# Patient Record
Sex: Male | Born: 1960 | Race: White | Hispanic: No | Marital: Married | Smoking: Never smoker
Health system: Southern US, Community
[De-identification: ages and names within clinical notes are randomized; demographics above are authoritative.]

## PROBLEM LIST (undated history)

## (undated) DIAGNOSIS — N200 Calculus of kidney: Secondary | ICD-10-CM

## (undated) DIAGNOSIS — I493 Ventricular premature depolarization: Secondary | ICD-10-CM

## (undated) DIAGNOSIS — J302 Other seasonal allergic rhinitis: Secondary | ICD-10-CM

## (undated) HISTORY — DX: Ventricular premature depolarization: I49.3

## (undated) HISTORY — DX: Other seasonal allergic rhinitis: J30.2

## (undated) HISTORY — DX: Calculus of kidney: N20.0

## (undated) HISTORY — PX: WISDOM TOOTH EXTRACTION: SHX21

---

## 1993-01-14 HISTORY — PX: WRIST FRACTURE SURGERY: SHX121

## 1997-01-14 DIAGNOSIS — N200 Calculus of kidney: Secondary | ICD-10-CM

## 1997-01-14 HISTORY — PX: OTHER SURGICAL HISTORY: SHX169

## 1997-01-14 HISTORY — DX: Calculus of kidney: N20.0

## 2004-10-11 ENCOUNTER — Ambulatory Visit: Payer: Self-pay | Admitting: Internal Medicine

## 2004-10-11 LAB — FECAL OCCULT BLOOD, GUAIAC: Fecal Occult Blood: NEGATIVE

## 2007-03-09 DIAGNOSIS — R748 Abnormal levels of other serum enzymes: Secondary | ICD-10-CM | POA: Insufficient documentation

## 2007-03-09 DIAGNOSIS — J309 Allergic rhinitis, unspecified: Secondary | ICD-10-CM

## 2007-03-13 ENCOUNTER — Ambulatory Visit: Payer: Self-pay | Admitting: Internal Medicine

## 2007-03-17 ENCOUNTER — Encounter (INDEPENDENT_AMBULATORY_CARE_PROVIDER_SITE_OTHER): Payer: Self-pay | Admitting: *Deleted

## 2007-03-18 ENCOUNTER — Encounter: Payer: Self-pay | Admitting: Internal Medicine

## 2007-03-23 ENCOUNTER — Encounter (INDEPENDENT_AMBULATORY_CARE_PROVIDER_SITE_OTHER): Payer: Self-pay | Admitting: *Deleted

## 2009-04-26 ENCOUNTER — Ambulatory Visit: Payer: Self-pay | Admitting: Internal Medicine

## 2009-04-26 DIAGNOSIS — Z87442 Personal history of urinary calculi: Secondary | ICD-10-CM

## 2009-05-01 LAB — CONVERTED CEMR LAB
BUN: 11 mg/dL (ref 6–23)
Basophils Absolute: 0 10*3/uL (ref 0.0–0.1)
Bilirubin, Direct: 0 mg/dL (ref 0.0–0.3)
CO2: 32 meq/L (ref 19–32)
Chloride: 103 meq/L (ref 96–112)
Cholesterol: 194 mg/dL (ref 0–200)
Creatinine, Ser: 1 mg/dL (ref 0.4–1.5)
Eosinophils Absolute: 0.3 10*3/uL (ref 0.0–0.7)
Glucose, Bld: 79 mg/dL (ref 70–99)
HCT: 42 % (ref 39.0–52.0)
LDL Cholesterol: 129 mg/dL — ABNORMAL HIGH (ref 0–99)
Lymphs Abs: 1.3 10*3/uL (ref 0.7–4.0)
MCHC: 34.1 g/dL (ref 30.0–36.0)
MCV: 86.3 fL (ref 78.0–100.0)
Monocytes Absolute: 0.3 10*3/uL (ref 0.1–1.0)
Neutrophils Relative %: 58.2 % (ref 43.0–77.0)
Platelets: 270 10*3/uL (ref 150.0–400.0)
RDW: 13.3 % (ref 11.5–14.6)
TSH: 0.8 microintl units/mL (ref 0.35–5.50)
Total Bilirubin: 0.4 mg/dL (ref 0.3–1.2)
Triglycerides: 84 mg/dL (ref 0.0–149.0)
WBC: 4.6 10*3/uL (ref 4.5–10.5)

## 2009-11-22 ENCOUNTER — Ambulatory Visit: Payer: Self-pay | Admitting: Internal Medicine

## 2009-11-22 DIAGNOSIS — R079 Chest pain, unspecified: Secondary | ICD-10-CM

## 2010-02-11 LAB — CONVERTED CEMR LAB
ALT: 18 units/L (ref 0–53)
BUN: 15 mg/dL (ref 6–23)
Bilirubin, Direct: 0.2 mg/dL (ref 0.0–0.3)
Calcium: 9.4 mg/dL (ref 8.4–10.5)
Eosinophils Absolute: 0.1 10*3/uL (ref 0.0–0.6)
GFR calc Af Amer: 93 mL/min
GFR calc non Af Amer: 77 mL/min
Glucose, Bld: 84 mg/dL (ref 70–99)
Lymphocytes Relative: 29.7 % (ref 12.0–46.0)
MCV: 86.9 fL (ref 78.0–100.0)
Monocytes Absolute: 0.4 10*3/uL (ref 0.2–0.7)
Monocytes Relative: 8.8 % (ref 3.0–11.0)
Neutro Abs: 2.5 10*3/uL (ref 1.4–7.7)
Platelets: 236 10*3/uL (ref 150–400)
Potassium: 4.1 meq/L (ref 3.5–5.1)
TSH: 0.68 microintl units/mL (ref 0.35–5.50)

## 2010-02-13 NOTE — Assessment & Plan Note (Signed)
Summary: pain rt side under arm/cbs   Vital Signs:  Patient profile:   50 year old male Weight:      167.2 pounds BMI:     22.92 Temp:     98.4 degrees F oral Pulse rate:   72 / minute Resp:     14 per minute BP sitting:   114 / 68  (left arm) Cuff size:   large  Vitals Entered By: Shonna Chock CMA (November 22, 2009 9:15 AM) CC: Pain under arm/rib cage area on right side x 4 weeks or longer    CC:  Pain under arm/rib cage area on right side x 4 weeks or longer .  History of Present Illness:      This is a 50 year old male who presents with chest pain X 4-5 weeks.  The patient denies resting chest pain, exertional chest pain, nausea, vomiting, diaphoresis, shortness of breath, palpitations, dizziness, light headedness, syncope, abdominal pain or indigestion.  The pain is described as intermittent,  dull for 15-60 minutees & sharp for seconds occasionally.  The pain is located in the right  lateral  chest  @ AAL and the pain does not radiate.  The pain is brought on or made worse by any activity after he gets up in am. It is not made worse by  coughing, deep breathing, exercising ( Elliptical & weight machine for 60 minute #X/ week) or  upper body movement.  The pain is relieved or improved with rest overnight.PMH of R posterior thorax shingles 2005.  Rx: none  Current Medications (verified): 1)  Loratadine 10 Mg .... As Needed  Allergies (verified): No Known Drug Allergies  Past History:  Past Medical History: Allergic rhinitis elevated alkaline phosphatase Premature ventricular contractions, PMH of  Nephrolithiasis, PMH  of X 1  Blood Donor ; No Lipid risk if LDL < 130 based on NMR Lipoprofile  Review of Systems General:  Denies chills, fever, and weight loss. Resp:  Denies coughing up blood, pleuritic, shortness of breath, sputum productive, and wheezing. Derm:  Denies lesion(s) and rash. Neuro:  Denies numbness, tingling, and weakness.  Physical Exam  General:   well-nourished,in no acute distress; alert,appropriate and cooperative throughout examination Chest Wall:  No  tenderness  to  palpation , compression . Lungs:  Normal respiratory effort, chest expands symmetrically. Lungs are clear to auscultation, no crackles or wheezes. Heart:  regular rhythm, no murmur, no gallop, no rub, no JVD, and bradycardia.   Abdomen:  Bowel sounds positive,abdomen soft and non-tender without masses, organomegaly or hernias noted. Msk:  R posterior thoracic muscles asymmetric R > L  suggesting scoliosis Pulses:  R and L carotid,radial pulses are full and equal bilaterally Extremities:  No clubbing, cyanosis, edema, or deformity noted with normal full range of motion of all joints.   Homan's neg Neurologic:  alert & oriented X3.   Skin:  minor vitiliginous scars R posterior thorax Cervical Nodes:  No lymphadenopathy noted Axillary Nodes:  No palpable lymphadenopathy Psych:  memory intact for recent and remote, normally interactive, and good eye contact.     Impression & Recommendations:  Problem # 1:  CHEST PAIN (ICD-786.50)  R/O T- 5 radicular pain; PMH of shingles in same area. Possible scoliosis  Orders: T-2 View CXR (71020TC) T-Thoracic Spine 2 Views (24401UU)  Complete Medication List: 1)  Loratadine 10 Mg  .... As needed 2)  Tramadol Hcl 50 Mg Tabs (Tramadol hcl) .... 1/2 -1 every 6 hrs as  needed  Patient Instructions: 1)  Films @ [address] Prescriptions: TRAMADOL HCL 50 MG TABS (TRAMADOL HCL) 1/2 -1 every 6 hrs as needed  #30 x 0   Entered and Authorized by:   Marga Melnick MD   Signed by:   Marga Melnick MD on 11/22/2009   Method used:   Print then Give to Patient   RxID:   [id number]    Orders Added: 1)  Est. Patient Level IV [62376] 2)  T-2 View CXR [71020TC] 3)  T-Thoracic Spine 2 Views [72070TC]

## 2010-02-13 NOTE — Assessment & Plan Note (Signed)
Summary: cpx/bringing in form/kdc   Vital Signs:  Patient profile:   50 year old male Height:      71.75 inches Weight:      165 pounds BMI:     22.62 Temp:     98.6 degrees F oral Pulse rate:   64 / minute Resp:     14 per minute BP sitting:   120 / 66  (left arm) Cuff size:   large  Vitals Entered By: Shonna Chock (April 26, 2009 11:02 AM)  Comments REVIEWED MED LIST, PATIENT AGREED DOSE AND INSTRUCTION CORRECT  **PATIENT AWARE TO RETURN ON FRIDAY FOR PPD(SKIN TEST READING)**   History of Present Illness: Timothy Miles is here for a physical; he is asymptomatic.  Preventive Screening-Counseling & Management  Caffeine-Diet-Exercise     Does Patient Exercise: yes  Allergies (verified): No Known Drug Allergies  Past History:  Past Medical History: Allergic rhinitis elevated alkaline phosphatase premature ventricular contractions, PMH of  Nephrolithiasis, hx of X 1  Blood Donor ; No Lipid risk if LDL < 130 based on NMR Lipoprofile  Past Surgical History: OP fracture L wrist Renal calculus OP (2000) Wisdom Teeth Extraction  Family History: Father: Melanoma Mother: basal cell skin cancer Siblings: brother: +/- DM; MGF lung cancer Daughter:  Angelman's Syndrome (3yo mental status, 15th chromosome partial deletion)  Social History: Never Smoked Alcohol use-yes: socially No diet; no caffeine Occupation:Maintenance Advice worker Married Regular exercise-yes: 3 hrs of CVE & weights  Review of Systems  The patient denies anorexia, fever, weight loss, weight gain, vision loss, decreased hearing, hoarseness, chest pain, syncope, dyspnea on exertion, peripheral edema, prolonged cough, headaches, hemoptysis, abdominal pain, melena, hematochezia, severe indigestion/heartburn, hematuria, suspicious skin lesions, depression, unusual weight change, abnormal bleeding, enlarged lymph nodes, and angioedema.    Physical Exam  General:  Well-developed,well-nourished;  alert,appropriate and cooperative throughout examination Head:  Normocephalic and atraumatic without obvious abnormalities. No apparent alopecia  Eyes:  No corneal or conjunctival inflammation noted.Perrla. Funduscopic exam benign, without hemorrhages, exudates or papilledema.  Ears:  External ear exam shows no significant lesions or deformities.  Otoscopic examination reveals clear canals, tympanic membranes are intact bilaterally without bulging, retraction, inflammation or discharge. Hearing is grossly normal bilaterally. Nose:  External nasal examination shows no deformity or inflammation. Nasal mucosa are pink and moist without lesions or exudates. Mouth:  Oral mucosa and oropharynx without lesions or exudates.  Teeth in good repair. Neck:  No deformities, masses, or tenderness noted. Minimal asymmetry of thyroid  Lungs:  Normal respiratory effort, chest expands symmetrically. Lungs are clear to auscultation, no crackles or wheezes. Heart:  Normal rate and regular rhythm. S1 and S2 normal without gallop, murmur, click, rub . S4 with slurring @ apex Abdomen:  Bowel sounds positive,abdomen  well muscled  and non-tender without masses, organomegaly or hernias noted. Rectal:  No external abnormalities noted. Normal sphincter tone. No rectal masses or tenderness. Genitalia:  Testes bilaterally descended without nodularity, tenderness or masses. No scrotal masses or lesions. No penis lesions or urethral discharge. Prostate:  no nodules, no asymmetry, no induration, and R asymmetrical  upper limits enlargement.   Msk:  No deformity or scoliosis noted of thoracic or lumbar spine.   Pulses:  R and L carotid,radial,dorsalis pedis and posterior tibial pulses are full and equal bilaterally Extremities:  No clubbing, cyanosis, edema, or deformity noted with normal full range of motion of all joints.   Neurologic:  alert & oriented X3 and DTRs symmetrical and 1/2+  Skin:  Intact without suspicious lesions or  rashes Cervical Nodes:  No lymphadenopathy noted Axillary Nodes:  No palpable lymphadenopathy Inguinal Nodes:  No significant adenopathy Psych:  memory intact for recent and remote, normally interactive, and good eye contact.     Impression & Recommendations:  Problem # 1:  ROUTINE GENERAL MEDICAL EXAM@HEALTH  CARE FACL (ICD-V70.0)  Orders: EKG w/ Interpretation (93000) Venipuncture (14782) TLB-Lipid Panel (80061-LIPID) TLB-BMP (Basic Metabolic Panel-BMET) (80048-METABOL) TLB-CBC Platelet - w/Differential (85025-CBCD) TLB-Hepatic/Liver Function Pnl (80076-HEPATIC) TLB-TSH (Thyroid Stimulating Hormone) (84443-TSH)  Problem # 2:  ALLERGIC RHINITIS (ICD-477.9)  Orders: Venipuncture (95621)  Problem # 3:  NEPHROLITHIASIS, HX OF (ICD-V13.01)  Orders: Venipuncture (30865)  Complete Medication List: 1)  Loratadine 10 Mg  .... As needed  Other Orders: Tdap => 50yrs IM (78469) TB Skin Test [phone]) Admin 1st Vaccine (84132) Admin of Any Addtl Vaccine (44010)  Patient Instructions: 1)  Carry corrected records when traveling   Immunizations Administered:  Tetanus Vaccine:    Vaccine Type: Tdap    Site: right deltoid    Mfr: GlaxoSmithKline    Dose: 0.5 ml    Route: IM    Given by: Chrae Malloy    Exp. Date: 04/08/2011    Lot #: [id number]    VIS given: 12/02/06 version given April 26, 2009.  PPD Skin Test:    Vaccine Type: PPD    Site: left forearm    Mfr: Sanofi Pasteur    Dose: 0.1 ml    Route: ID    Given by: Shonna Chock    Exp. Date: 06/11/2011    Lot #: [id number]     Appended Document: cpx/bringing in form/kdc  Laboratory Results   Urine Tests   Date/Time Reported: April 26, 2009 1:10 PM   Routine Urinalysis   Color: yellow Appearance: Clear Glucose: negative   (Normal Range: Negative) Bilirubin: negative   (Normal Range: Negative) Ketone: negative   (Normal Range: Negative) Spec. Gravity: 1.010   (Normal Range: 1.003-1.035) Blood: negative    (Normal Range: Negative) pH: 6.0   (Normal Range: 5.0-8.0) Protein: negative   (Normal Range: Negative) Urobilinogen: negative   (Normal Range: 0-1) Nitrite: negative   (Normal Range: Negative) Leukocyte Esterace: negative   (Normal Range: Negative)    Comments: Floydene Flock  April 26, 2009 1:10 PM      Appended Document: cpx/bringing in form/kdc  Laboratory Results       PPD Results    Date of reading: 04/28/2009    Results: < 5mm    Interpretation: negative

## 2010-03-14 ENCOUNTER — Encounter: Payer: Self-pay | Admitting: Internal Medicine

## 2010-04-30 ENCOUNTER — Encounter: Payer: Self-pay | Admitting: Internal Medicine

## 2010-04-30 ENCOUNTER — Ambulatory Visit (INDEPENDENT_AMBULATORY_CARE_PROVIDER_SITE_OTHER): Payer: BC Managed Care – PPO | Admitting: Internal Medicine

## 2010-04-30 VITALS — BP 112/68 | HR 72 | Temp 97.9°F | Resp 14 | Ht 72.0 in | Wt 164.8 lb

## 2010-04-30 DIAGNOSIS — Z0000 Encounter for general adult medical examination without abnormal findings: Secondary | ICD-10-CM

## 2010-04-30 DIAGNOSIS — J302 Other seasonal allergic rhinitis: Secondary | ICD-10-CM

## 2010-04-30 DIAGNOSIS — N429 Disorder of prostate, unspecified: Secondary | ICD-10-CM

## 2010-04-30 DIAGNOSIS — Z136 Encounter for screening for cardiovascular disorders: Secondary | ICD-10-CM

## 2010-04-30 DIAGNOSIS — E785 Hyperlipidemia, unspecified: Secondary | ICD-10-CM

## 2010-04-30 DIAGNOSIS — J309 Allergic rhinitis, unspecified: Secondary | ICD-10-CM

## 2010-04-30 LAB — CBC WITH DIFFERENTIAL/PLATELET
Basophils Relative: 0.8 % (ref 0.0–3.0)
Eosinophils Relative: 2 % (ref 0.0–5.0)
Hemoglobin: 14.4 g/dL (ref 13.0–17.0)
Lymphocytes Relative: 27.8 % (ref 12.0–46.0)
MCHC: 34 g/dL (ref 30.0–36.0)
Monocytes Relative: 9.8 % (ref 3.0–12.0)
Neutro Abs: 2.2 10*3/uL (ref 1.4–7.7)
RBC: 4.87 Mil/uL (ref 4.22–5.81)
WBC: 3.7 10*3/uL — ABNORMAL LOW (ref 4.5–10.5)

## 2010-04-30 LAB — HEPATIC FUNCTION PANEL: Total Bilirubin: 1.2 mg/dL (ref 0.3–1.2)

## 2010-04-30 LAB — BASIC METABOLIC PANEL
CO2: 30 mEq/L (ref 19–32)
Chloride: 104 mEq/L (ref 96–112)
Creatinine, Ser: 1 mg/dL (ref 0.4–1.5)
Sodium: 139 mEq/L (ref 135–145)

## 2010-04-30 LAB — LIPID PANEL
HDL: 47.1 mg/dL (ref >39.00)
Total CHOL/HDL Ratio: 4
VLDL: 11 mg/dL (ref 0.0–40.0)

## 2010-04-30 LAB — LDL CHOLESTEROL, DIRECT: Direct LDL: 143 mg/dL

## 2010-04-30 LAB — PSA: PSA: 0.53 ng/mL (ref 0.10–4.00)

## 2010-04-30 LAB — TSH: TSH: 0.49 u[IU]/mL (ref 0.35–5.50)

## 2010-04-30 NOTE — Patient Instructions (Signed)
Preventive Health Care: Exercise at least 30-45 minutes a day,  3-4 days a week.  Eat a low-fat diet with lots of fruits and vegetables, up to 7-9 servings per day. Avoid obesity; your goal is waist measurement < 40 inches.Consume less than 40 grams of sugar per day from foods & drinks with High Fructose Corn Sugar as #2,3 or # 4 on label. Eye Doctor - have an eye exam @ least annually.

## 2010-04-30 NOTE — Progress Notes (Signed)
Subjective:    Patient ID: Timothy Miles, male    DOB: 05-Nov-1960, 50 y.o.   MRN: [id number]  HPI He is here for a physical;his only active issue is seasonal rhinoconjunctivitis which is well controlled by Loratidine as needed. CVE 3 X/ week for 60 minutes & gardening w/o symptoms.    Review of Systems  Patient reports no  vision/ hearing changes,anorexia, weight change, fever ,adenopathy, persistant / recurrent hoarseness, swallowing issues, chest pain,palpitations, edema,persistant / recurrent cough, hemoptysis, dyspnea(rest, exertional, paroxysmal nocturnal), gastrointestinal  bleeding (melena, rectal bleeding), abdominal pain, excessive heart burn, GU symptoms( dysuria, hematuria, pyuria, voiding/incontinence  issues) syncope, focal weakness, memory loss,numbness & tingling, skin/hair/nail changes,depression, anxiety, abnormal bruising/bleeding, musculoskeletal symptoms/signs.      Objective:   Physical Exam Gen.: Thin, healthy and well-nourished in appearance. Alert, appropriate and cooperative throughout exam. Head: Normocephalic without obvious abnormalities;  no alopecia  Eyes: No corneal or conjunctival inflammation noted. Pupils equal round reactive to light and accommodation. Fundal exam is benign without hemorrhages, exudate, papilledema. Extraocular motion intact. Vision grossly normal. Ears: External  ear exam reveals no significant lesions or deformities. Canals clear .TMs normal. Hearing is grossly normal bilaterally. Nose: External nasal exam reveals no deformity or inflammation. Nasal mucosa are pink and moist. No lesions or exudates noted. Septum  w/o deviation / dislocation.  Mouth: Oral mucosa and oropharynx reveal no lesions or exudates. Teeth in good repair. Neck: No deformities, masses, or tenderness noted. Range of motion  Normal . Thyroid  Physiologic asymmetry , R > L w/o nodules. Lungs: Normal respiratory effort; chest expands symmetrically. Lungs are clear to  auscultation without rales, wheezes, or increased work of breathing. Heart: Normal rate and rhythm. Normal S1 and S2. No gallop, click, or rub. S4 ;  No  murmur. Abdomen: Bowel sounds normal; abdomen soft and nontender. No masses, organomegaly or hernias noted. Genitalia: small L varicocoele & tiny granuloma. Asymmetry of prostate ; L 1.25 + ; R 1.0 +  w/o noodules.                                                                                     Musculoskeletal/extremities: No deformity or scoliosis noted of  the thoracic or lumbar spine. No clubbing, cyanosis, edema, or deformity noted. Range of motion  normal .Tone & strength  normal.Joints normal. Nail health  good. Vascular: Carotid, radial artery, dorsalis pedis and dorsalis posterior tibial pulses are full and equal. No bruits present. Neurologic: Alert and oriented x3. Deep tendon reflexes symmetrical and normal.         Skin: Intact without suspicious lesions or rashes. Lymph: No cervical, axillary, or inguinal lymphadenopathy present. Psych: Mood and affect are normal. Normally interactive  Assessment & Plan:  #1 wellness exam; no significant findings  #2 seasonal rhinoconjunctivitis; good control with Loratadine  #3 mild asymmetry of the prostate; negative family history for prostate disease.  Plan: #1 in addition to the routine fasting labs; PSA will be added to assess the minimal asymmetry of the prostate.

## 2010-11-05 ENCOUNTER — Ambulatory Visit (INDEPENDENT_AMBULATORY_CARE_PROVIDER_SITE_OTHER): Payer: BC Managed Care – PPO | Admitting: Internal Medicine

## 2010-11-05 ENCOUNTER — Encounter: Payer: Self-pay | Admitting: Internal Medicine

## 2010-11-05 DIAGNOSIS — M25462 Effusion, left knee: Secondary | ICD-10-CM

## 2010-11-05 DIAGNOSIS — M25569 Pain in unspecified knee: Secondary | ICD-10-CM

## 2010-11-05 DIAGNOSIS — M25562 Pain in left knee: Secondary | ICD-10-CM

## 2010-11-05 DIAGNOSIS — M25469 Effusion, unspecified knee: Secondary | ICD-10-CM

## 2010-11-05 NOTE — Progress Notes (Signed)
  Subjective:    Patient ID: Timothy Miles, male    DOB: [birth year], 50 y.o.   MRN: [id number]  HPI Extremity pain Location:L knee Onset:10/17  Trigger/injury:no ; started as stiffness after sitting 1 hr Pain quality:dull Pain severity:up to 8 on 10/20; it woke him Duration:dull pain constant; sharp intermittently Radiation:no Exacerbating factors:stairs Treatment/response:NSAIDS with benefit Review of systems: Constitutional: no fever, chills, sweats  Musculoskeletal:no  muscle cramps or pain; no  joint  redness, or swelling Skin:no rash, color change Neuro: no w eakness; incontinence (stool/urine); numbness and tingling Heme:no lymphadenopathy; abnormal bruising or bleeding       Review of Systems     Objective:   Physical Exam He appears healthy and well-nourished.  There is ballotable effusion of the left knee and repetitive click with knee flexion. There is decreased range of motion of the left knee to flexion. A popliteal cyst is suggested on the left posteriorly.  Pes planus is present. Nail health is excellent. Dorsalis pedis pulses are slightly decreased, but  hair growth is normal and there is no ischemic change in the legs. Trace edema is suggested at the left ankle       Assessment & Plan:  #1 acute left knee pain with effusion and formation of popliteal cyst. This may be related to a running injury  Plan: Aleve one to 2 every 12 hours with food as needed. He should stop his exercise program. Orthopedic consultation will be pursued

## 2010-11-05 NOTE — Patient Instructions (Addendum)
Use an anti-inflammatory cream such as Aspercreme or Zostrix cream twice a day to the left knee as needed. In lieu of this warm moist compresses or  hot water bottle can be used. Do not apply ice to the knees.   A lead 1-2 every 8-12 hours as needed with food for pain and swelling in the knee

## 2011-01-15 HISTORY — PX: COLONOSCOPY: SHX174

## 2011-02-06 ENCOUNTER — Encounter: Payer: Self-pay | Admitting: Internal Medicine

## 2011-02-06 ENCOUNTER — Ambulatory Visit (INDEPENDENT_AMBULATORY_CARE_PROVIDER_SITE_OTHER): Payer: BC Managed Care – PPO | Admitting: Internal Medicine

## 2011-02-06 VITALS — BP 106/78 | HR 61 | Temp 98.2°F | Wt 160.0 lb

## 2011-02-06 DIAGNOSIS — M19039 Primary osteoarthritis, unspecified wrist: Secondary | ICD-10-CM

## 2011-02-06 DIAGNOSIS — M659 Synovitis and tenosynovitis, unspecified: Secondary | ICD-10-CM

## 2011-02-06 NOTE — Progress Notes (Signed)
  Subjective:    Patient ID: Timothy Miles, male    DOB: 03-08-1960, 52 y.o.   MRN: [id number]  HPI Extremity pain Location:L elbow Onset:10/12 Course:improved in past 6 weeks but not resolved Trigger/injury:lifting Pain quality:sharp Pain severity:up to 4 Duration:only while lifting Radiation:no Exacerbating factors:liftingmedially  with bending elbow with palm down Treatment/response:avoiding trigger Review of systems: Constitutional: no fever, chills, sweats, change in weight  Musculoskeletal:no  muscle cramps or pain; no  joint stiffness, redness, or swelling Skin:no rash, color change Neuro: no weakness; numbness and tingling Heme:no lymphadenopathy; abnormal bruising or bleeding       Review of Systems     Objective:   Physical Exam he is thin but well-nourished and healthy in appearance  He has no lymphadenopathy about the neck, axilla, or epitrochlear areas  Range of motion of the neck is normal without associated pain. The right paraspinous muscles are larger than the left suggesting subclinical scoliosis.  Deep tendon reflexes are 1+ and equal  Strength and tone are normal  He has no significant joint changes in the hands  Classic the osteomyelitis is suggested at the left elbow with handshaking & pronation maneuver          Assessment & Plan:   #1 classic osteoarthritis, left elbow  plan: Exercises and other options discussed

## 2011-02-06 NOTE — Patient Instructions (Signed)
Perform the exercises for elbow pain  twice a day as discussed. Apply the anti-inflammatory gel after the exercises.  Consider glucosamine sulfate 1500 mg daily for joint symptoms. Take this daily for 4-6 weeks &  then  up to 3 months on and  off for 2 months if recurret. This will rehydrate the cartilages.

## 2012-01-06 ENCOUNTER — Encounter (HOSPITAL_BASED_OUTPATIENT_CLINIC_OR_DEPARTMENT_OTHER): Payer: Self-pay | Admitting: Emergency Medicine

## 2012-01-06 ENCOUNTER — Emergency Department (HOSPITAL_BASED_OUTPATIENT_CLINIC_OR_DEPARTMENT_OTHER): Payer: Worker's Compensation

## 2012-01-06 ENCOUNTER — Emergency Department (HOSPITAL_BASED_OUTPATIENT_CLINIC_OR_DEPARTMENT_OTHER)
Admission: EM | Admit: 2012-01-06 | Discharge: 2012-01-06 | Disposition: A | Discharge status: Home / Self Care | Payer: Worker's Compensation | Attending: Emergency Medicine | Admitting: Emergency Medicine

## 2012-01-06 DIAGNOSIS — Y9389 Activity, other specified: Secondary | ICD-10-CM | POA: Insufficient documentation

## 2012-01-06 DIAGNOSIS — IMO0002 Reserved for concepts with insufficient information to code with codable children: Secondary | ICD-10-CM | POA: Insufficient documentation

## 2012-01-06 DIAGNOSIS — Y990 Civilian activity done for income or pay: Secondary | ICD-10-CM | POA: Insufficient documentation

## 2012-01-06 DIAGNOSIS — S0180XA Unspecified open wound of other part of head, initial encounter: Secondary | ICD-10-CM | POA: Insufficient documentation

## 2012-01-06 DIAGNOSIS — S0191XA Laceration without foreign body of unspecified part of head, initial encounter: Secondary | ICD-10-CM

## 2012-01-06 DIAGNOSIS — Z8709 Personal history of other diseases of the respiratory system: Secondary | ICD-10-CM | POA: Insufficient documentation

## 2012-01-06 DIAGNOSIS — Y9269 Other specified industrial and construction area as the place of occurrence of the external cause: Secondary | ICD-10-CM | POA: Insufficient documentation

## 2012-01-06 DIAGNOSIS — Z79899 Other long term (current) drug therapy: Secondary | ICD-10-CM | POA: Insufficient documentation

## 2012-01-06 DIAGNOSIS — Z87442 Personal history of urinary calculi: Secondary | ICD-10-CM | POA: Insufficient documentation

## 2012-01-06 MED ORDER — LIDOCAINE-EPINEPHRINE 2 %-1:100000 IJ SOLN
INTRAMUSCULAR | Status: AC
  Filled 2012-01-06: qty 1

## 2012-01-06 MED ORDER — IBUPROFEN 600 MG PO TABS: 600.0000 mg | ORAL_TABLET | Freq: Four times a day (QID) | ORAL | Status: DC | PRN

## 2012-01-06 NOTE — ED Notes (Signed)
Pt was working this morning when a door hit him in the face. States he had on his safety goggles. Laceration noted above left eye. Lac over one inch in length.  No bleeding noted. Ice placed to site. Denies loc.

## 2012-01-06 NOTE — ED Notes (Signed)
Report received and care assumed. Awaiting CT results and workmans comp UDS.

## 2012-01-06 NOTE — ED Provider Notes (Signed)
History     CSN: [id number]  Arrival date & time 01/06/12  0621   First MD Initiated Contact with Patient 01/06/12 0630      No chief complaint on file.   (Consider location/radiation/quality/duration/timing/severity/associated sxs/prior treatment) Patient is a 51 y.o. male presenting with head injury and scalp laceration. The history is provided by the patient. No language interpreter was used.  Head Injury  The incident occurred less than 1 hour ago. He came to the ER via walk-in. The injury mechanism was a direct blow. There was no loss of consciousness. The volume of blood lost was minimal. The quality of the pain is described as sharp. The pain has been constant since the injury. Pertinent negatives include no numbness, no blurred vision, no vomiting, no tinnitus, no disorientation, no weakness and no memory loss. He has tried nothing for the symptoms. The treatment provided no relief.  Head Laceration This is a new problem. The current episode started less than 1 hour ago. The problem occurs constantly. The problem has not changed since onset.Pertinent negatives include no chest pain and no abdominal pain. Nothing aggravates the symptoms. Nothing relieves the symptoms. He has tried nothing for the symptoms. The treatment provided no relief.    Past Medical History  Diagnosis Date  . Renal calculus 1998  . Allergic rhinitis, seasonal   . Elevated alkaline phosphatase level   . Premature ventricular contractions   . Blood donor     no lipid risk if LDL < 130 based on NMR Lipoprofile    Past Surgical History  Procedure Date  . Wrist fracture surgery 1995    Left wrist  . Wisdom tooth extraction   . Renal calculus op 2000    Family History  Problem Relation Age of Onset  . Melanoma Father   . Cancer Father     melanoma  . Skin cancer Mother     Basal cell  . Cancer Mother     basal cell skin cancer  . Diabetes Brother     diet controlled  . Lung cancer Maternal  Grandfather   . Cancer Maternal Grandfather     lung  . Angelman syndrome Daughter     15th Chromosome partial deletion  . Other Daughter     angelman's syndrome ( 3 yr old mental status, 15th chromosome partial deletion)    History  Substance Use Topics  . Smoking status: Never Smoker   . Smokeless tobacco: Not on file  . Alcohol Use: 0.6 oz/week    1 Glasses of wine per week     Comment: Social      Review of Systems  HENT: Negative for neck pain, neck stiffness and tinnitus.   Eyes: Negative for blurred vision, photophobia and visual disturbance.  Cardiovascular: Negative for chest pain.  Gastrointestinal: Negative for vomiting and abdominal pain.  Neurological: Negative for weakness and numbness.  Psychiatric/Behavioral: Negative for memory loss.  All other systems reviewed and are negative.    Allergies  Review of patient's allergies indicates no known allergies.  Home Medications   Current Outpatient Rx  Name  Route  Sig  Dispense  Refill  . LORATADINE 10 MG PO TABS   Oral   Take 10 mg by mouth daily as needed.          Marland Kitchen TRAMADOL HCL 50 MG PO TABS   Oral   Take 50 mg by mouth every 6 (six) hours as needed. 1/2-1 by mouth every 6 hours  as needed            There were no vitals taken for this visit.  Physical Exam  Constitutional: He is oriented to person, place, and time. He appears well-developed and well-nourished. He appears distressed.  HENT:  Head: Macrocephalic.    Right Ear: No hemotympanum.  Left Ear: No hemotympanum.  Mouth/Throat: Oropharynx is clear and moist.  Eyes: Conjunctivae normal and EOM are normal. Pupils are equal, round, and reactive to light.  Neck: Normal range of motion. Neck supple.  Cardiovascular: Normal rate and regular rhythm.   Pulmonary/Chest: Effort normal and breath sounds normal. He has no wheezes. He has no rales.  Abdominal: Soft. Bowel sounds are normal. There is no tenderness.  Musculoskeletal: Normal  range of motion. He exhibits no tenderness.  Neurological: He is alert and oriented to person, place, and time. No cranial nerve deficit.  Skin: Skin is warm and dry.  Psychiatric: He has a normal mood and affect.    ED Course  Procedures (including critical care time)  Labs Reviewed - No data to display No results found.   No diagnosis found.    MDM  LACERATION REPAIR Performed by: Jasmine Awe Authorized by: Jasmine Awe Consent: Verbal consent obtained. Risks and benefits: risks, benefits and alternatives were discussed Consent given by: patient Patient identity confirmed: provided demographic data Prepped and Draped in normal sterile fashion Wound explored  Laceration Location: left eyebroa  Laceration Length:  3 cm total  No Foreign Bodies seen or palpated  Anesthesia: local infiltration  Local anesthetic: lidocaine 1%  With epinephrine  Anesthetic total: 3 ml  Irrigation method: syringe Amount of cleaning: extensive  Skin closure:5.0 ethilon   Number of sutures: 7  Technique: interrupted   Patient tolerance: Patient tolerated the procedure well with no immediate complications.   Follow up in 5-7 days for suture removal.  Patient verbalizes understanding and agrees to follow up      Clee Pandit Smitty Cords, MD 01/06/12 [phone]

## 2012-02-04 ENCOUNTER — Encounter: Payer: Self-pay | Admitting: Internal Medicine

## 2012-02-04 ENCOUNTER — Ambulatory Visit (INDEPENDENT_AMBULATORY_CARE_PROVIDER_SITE_OTHER): Payer: BC Managed Care – PPO | Admitting: Internal Medicine

## 2012-02-04 VITALS — BP 114/78 | HR 75 | Temp 98.3°F | Resp 12 | Ht 71.08 in | Wt 167.0 lb

## 2012-02-04 DIAGNOSIS — Z0000 Encounter for general adult medical examination without abnormal findings: Secondary | ICD-10-CM

## 2012-02-04 DIAGNOSIS — E785 Hyperlipidemia, unspecified: Secondary | ICD-10-CM | POA: Insufficient documentation

## 2012-02-04 DIAGNOSIS — Z1211 Encounter for screening for malignant neoplasm of colon: Secondary | ICD-10-CM

## 2012-02-04 LAB — CBC WITH DIFFERENTIAL/PLATELET
Basophils Relative: 0.5 % (ref 0.0–3.0)
Eosinophils Absolute: 0.2 10*3/uL (ref 0.0–0.7)
Eosinophils Relative: 3.6 % (ref 0.0–5.0)
Hemoglobin: 15.1 g/dL (ref 13.0–17.0)
Lymphocytes Relative: 26.3 % (ref 12.0–46.0)
MCHC: 34.4 g/dL (ref 30.0–36.0)
Neutro Abs: 3.1 10*3/uL (ref 1.4–7.7)
RBC: 5.27 Mil/uL (ref 4.22–5.81)
WBC: 5 10*3/uL (ref 4.5–10.5)

## 2012-02-04 LAB — BASIC METABOLIC PANEL
CO2: 28 mEq/L (ref 19–32)
Calcium: 9.1 mg/dL (ref 8.4–10.5)
Chloride: 104 mEq/L (ref 96–112)
Sodium: 138 mEq/L (ref 135–145)

## 2012-02-04 LAB — HEPATIC FUNCTION PANEL
ALT: 29 U/L (ref 0–53)
AST: 25 U/L (ref 0–37)
Albumin: 4 g/dL (ref 3.5–5.2)
Alkaline Phosphatase: 111 U/L (ref 39–117)
Total Protein: 7.4 g/dL (ref 6.0–8.3)

## 2012-02-04 LAB — LIPID PANEL: HDL: 47 mg/dL (ref >39.00)

## 2012-02-04 LAB — LDL CHOLESTEROL, DIRECT: Direct LDL: 148.8 mg/dL

## 2012-02-04 NOTE — Progress Notes (Signed)
  Subjective:    Patient ID: Timothy Miles, male    DOB: 1960-10-12, 52 y.o.   MRN: [id number]  HPI  Timothy Miles  is here for a physical ;he denies acute issues.     Review of Systems He is on a heart healthy diet; he exercises 3 hours per week without symptoms.He ran 8 K in 11/2011. Specifically he denies chest pain, palpitations, dyspnea, or claudication. Family history is negative for premature coronary disease. Advanced cholesterol testing reveals his LDL goal was less than 120.      Objective:   Physical Exam Gen.: Thin but healthy and well-nourished in appearance. Alert, appropriate and cooperative throughout exam. Appears younger than stated age  Head: Normocephalic without obvious abnormalities; no alopecia  Eyes: No corneal or conjunctival inflammation noted. Pupils equal round reactive to light and accommodation. Fundal exam is benign without hemorrhages, exudate, papilledema. Extraocular motion intact. Vision grossly normal. Ears: External  ear exam reveals no significant lesions or deformities. Canals clear .TMs normal. Hearing is grossly normal bilaterally. Nose: External nasal exam reveals no deformity or inflammation. Nasal mucosa are pink and moist. No lesions or exudates noted.   Mouth: Oral mucosa and oropharynx reveal no lesions or exudates. Teeth in good repair. Neck: No deformities, masses, or tenderness noted. Range of motion normal. Thyroid : asymmetric , R > L w/o nodule. Lungs: Normal respiratory effort; chest expands symmetrically. Lungs are clear to auscultation without rales, wheezes, or increased work of breathing. Heart: Normal rate and rhythm. Normal S1 and S2. No gallop, click, or rub. S 4 w/o murmur. Abdomen: Bowel sounds normal; abdomen soft and nontender. No masses, organomegaly or hernias noted. Genitalia: Genitalia normal . Prostate is minimally asymmetric without enlargement,  nodularity, or induration.  (PSA< 1.00 X 2)                                     Musculoskeletal/extremities: There is subtle asymmetry of the posterior thoracic musculature suggesting occult scoliosis.  No clubbing, cyanosis, edema, or significant extremity  deformity noted. Range of motion normal .Tone & strength  normal.Joints normal. Nail health good. Able to lie down & sit up w/o help. Negative SLR bilaterally Vascular: Carotid, radial artery, dorsalis pedis and  posterior tibial pulses are full and equal. No bruits present. Neurologic: Alert and oriented x3. Deep tendon reflexes symmetrical and normal. Gait normal.        Skin: Intact without suspicious lesions or rashes. Lymph: No cervical, axillary, or inguinal lymphadenopathy present. Psych: Mood and affect are normal. Normally interactive                                                                                        Assessment & Plan:  #1 comprehensive physical exam; no acute findings #2 see Problem List with Assessments & Recommendations Plan: see Orders

## 2012-02-04 NOTE — Patient Instructions (Addendum)
Preventive Health Care: Eat a low-fat diet with lots of fruits and vegetables, up to 7-9 servings per day.  Consume less than 40 grams of sugar per day from foods & drinks with High Fructose Corn Sugar as #2,3 or # 4 on label. Plain Mucinex (NOT D) for thick secretions ;force NON dairy fluids .   Nasal cleansing in the shower as discussed with lather of mild shampoo.After 10 seconds wash off lather while  exhaling through nostrils. Make sure that all residual soap is removed to prevent irritation.  Use a Neti pot daily only  as needed for significant sinus congestion; going from open side to congested side . Plain Allegra (NOT D )  160 daily , Loratidine 10 mg , OR Zyrtec 10 mg @ bedtime  as needed for itchy eyes & sneezing. As per the Standard of Care , screening Colonoscopy recommended @ 50 & every 5-10 years thereafter . More frequent monitor would be dictated by family history or findings @ Colonoscopy

## 2012-02-05 ENCOUNTER — Encounter: Payer: Self-pay | Admitting: Gastroenterology

## 2012-03-10 ENCOUNTER — Encounter: Payer: Self-pay | Admitting: Gastroenterology

## 2012-03-16 ENCOUNTER — Encounter: Payer: Self-pay | Admitting: Gastroenterology

## 2012-03-24 ENCOUNTER — Encounter: Payer: Self-pay | Admitting: Gastroenterology

## 2012-05-08 ENCOUNTER — Ambulatory Visit (AMBULATORY_SURGERY_CENTER): Payer: BC Managed Care – PPO

## 2012-05-08 VITALS — Ht 72.0 in | Wt 168.0 lb

## 2012-05-08 DIAGNOSIS — Z1211 Encounter for screening for malignant neoplasm of colon: Secondary | ICD-10-CM

## 2012-05-08 MED ORDER — NA SULFATE-K SULFATE-MG SULF 17.5-3.13-1.6 GM/177ML PO SOLN: 1.0000 | Freq: Once | ORAL | Status: DC

## 2012-05-11 ENCOUNTER — Encounter: Payer: Self-pay | Admitting: Gastroenterology

## 2012-05-22 ENCOUNTER — Encounter: Payer: Self-pay | Admitting: Gastroenterology

## 2012-05-22 ENCOUNTER — Ambulatory Visit (AMBULATORY_SURGERY_CENTER): Payer: BC Managed Care – PPO | Admitting: Gastroenterology

## 2012-05-22 VITALS — BP 114/75 | HR 74 | Temp 97.3°F | Resp 26 | Ht 72.0 in | Wt 168.0 lb

## 2012-05-22 DIAGNOSIS — Z1211 Encounter for screening for malignant neoplasm of colon: Secondary | ICD-10-CM

## 2012-05-22 DIAGNOSIS — K648 Other hemorrhoids: Secondary | ICD-10-CM

## 2012-05-22 MED ORDER — SODIUM CHLORIDE 0.9 % IV SOLN: 500.0000 mL | INTRAVENOUS | Status: DC

## 2012-05-22 NOTE — Op Note (Signed)
[facility] [address] [place], [address]   COLONOSCOPY PROCEDURE REPORT  PATIENT: Timothy Miles, Timothy Miles  MR#: [id number] BIRTHDATE: 07-Jun-1960 , 51  yrs. old GENDER: Male ENDOSCOPIST: Louis Meckel, MD REFERRED LK:[id number] Alwyn Ren, M.D. PROCEDURE DATE:  05/22/2012 PROCEDURE:   Colonoscopy, diagnostic ASA CLASS: INDICATIONS:average risk screening. MEDICATIONS: MAC sedation, administered by CRNA and Propofol (Diprivan) 280 mg IV  DESCRIPTION OF PROCEDURE:   After the risks benefits and alternatives of the procedure were thoroughly explained, informed consent was obtained.  A digital rectal exam revealed no abnormalities of the rectum.   The LB PCF-[id number]  endoscope was introduced through the anus and advanced to the cecum, which was identified by both the appendix and ileocecal valve. No adverse events experienced.   The quality of the prep was excellent using Suprep  The instrument was then slowly withdrawn as the colon was fully examined.      COLON FINDINGS: Internal hemorrhoids were found.   The colon mucosa was otherwise normal.  Retroflexed views revealed no abnormalities. The time to cecum=6 minutes 29 seconds.  Withdrawal time=6 minutes 03 seconds.  The scope was withdrawn and the procedure completed. COMPLICATIONS: There were no complications.  ENDOSCOPIC IMPRESSION: 1.   Internal hemorrhoids 2.   The colon mucosa was otherwise normal  RECOMMENDATIONS: Continue current colorectal screening recommendations for "routine risk" patients with a repeat colonoscopy in 10 years.   eSigned:  Louis Meckel, MD 05/22/2012 10:57 AM   cc:

## 2012-05-22 NOTE — Patient Instructions (Addendum)
YOU HAD AN ENDOSCOPIC PROCEDURE TODAY AT THE [facility]: Refer to the procedure report that was given to you for any specific questions about what was found during the examination.  If the procedure report does not answer your questions, please call your gastroenterologist to clarify.  If you requested that your care partner not be given the details of your procedure findings, then the procedure report has been included in a sealed envelope for you to review at your convenience later.  YOU SHOULD EXPECT: Some feelings of bloating in the abdomen. Passage of more gas than usual.  Walking can help get rid of the air that was put into your GI tract during the procedure and reduce the bloating. If you had a lower endoscopy (such as a colonoscopy or flexible sigmoidoscopy) you may notice spotting of blood in your stool or on the toilet paper. If you underwent a bowel prep for your procedure, then you may not have a normal bowel movement for a few days.  DIET: Your first meal following the procedure should be a light meal and then it is ok to progress to your normal diet.  A half-sandwich or bowl of soup is an example of a good first meal.  Heavy or fried foods are harder to digest and may make you feel nauseous or bloated.  Likewise meals heavy in dairy and vegetables can cause extra gas to form and this can also increase the bloating.  Drink plenty of fluids but you should avoid alcoholic beverages for 24 hours.  ACTIVITY: Your care partner should take you home directly after the procedure.  You should plan to take it easy, moving slowly for the rest of the day.  You can resume normal activity the day after the procedure however you should NOT DRIVE or use heavy machinery for 24 hours (because of the sedation medicines used during the test).    SYMPTOMS TO REPORT IMMEDIATELY: A gastroenterologist can be reached at any hour.  During normal business hours, 8:30 AM to 5:00 PM Monday through Friday,  call [phone].  After hours and on weekends, please call the GI answering service at [phone] who will take a message and have the physician on call contact you.   Following lower endoscopy (colonoscopy or flexible sigmoidoscopy):  Excessive amounts of blood in the stool  Significant tenderness or worsening of abdominal pains  Swelling of the abdomen that is new, acute  Fever of 100F or higher     FOLLOW UP: If any biopsies were taken you will be contacted by phone or by letter within the next 1-3 weeks.  Call your gastroenterologist if you have not heard about the biopsies in 3 weeks.  Our staff will call the home number listed on your records the next business day following your procedure to check on you and address any questions or concerns that you may have at that time regarding the information given to you following your procedure. This is a courtesy call and so if there is no answer at the home number and we have not heard from you through the emergency physician on call, we will assume that you have returned to your regular daily activities without incident.  SIGNATURES/CONFIDENTIALITY: You and/or your care partner have signed paperwork which will be entered into your electronic medical record.  These signatures attest to the fact that that the information above on your After Visit Summary has been reviewed and is understood.  Full responsibility of the  confidentiality of this discharge information lies with you and/or your care-partner.  Continue current colorectal screenings recommendations for routine risk patients for a repeat colonoscopy in 10 years

## 2012-05-22 NOTE — Progress Notes (Signed)
Report to pacu rn, vss, bbs=clear 

## 2012-05-22 NOTE — Progress Notes (Signed)
Charting documented by Felecia Shelling  Patient did not experience any of the following events: a burn prior to discharge; a fall within the facility; wrong site/side/patient/procedure/implant event; or a hospital transfer or hospital admission upon discharge from the facility. [phone])  Patient did not have preoperative order for IV antibiotic SSI prophylaxis. [phone])

## 2012-05-25 ENCOUNTER — Telehealth: Payer: Self-pay

## 2012-05-25 NOTE — Telephone Encounter (Signed)
  Follow up Call-  Call back number 05/22/2012  Post procedure Call Back phone  # [phone]  Permission to leave phone message Yes     Patient questions:  Do you have a fever, pain , or abdominal swelling? no Pain Score  0 *  Have you tolerated food without any problems? yes  Have you been able to return to your normal activities? yes  Do you have any questions about your discharge instructions: Diet   no Medications  no Follow up visit  no  Do you have questions or concerns about your Care? no  Actions: * If pain score is 4 or above: No action needed, pain <4.  Per the pt no problems.  He said things went better than he though they would. Maw

## 2012-09-23 IMAGING — CR DG CHEST 2V
2 series · 2 of 2 positions shown · non-contrast
Comparison: None.

CLINICAL DATA: Right-sided chest pain.

CHEST - 2 VIEW

[view not recorded (1 of 2)]
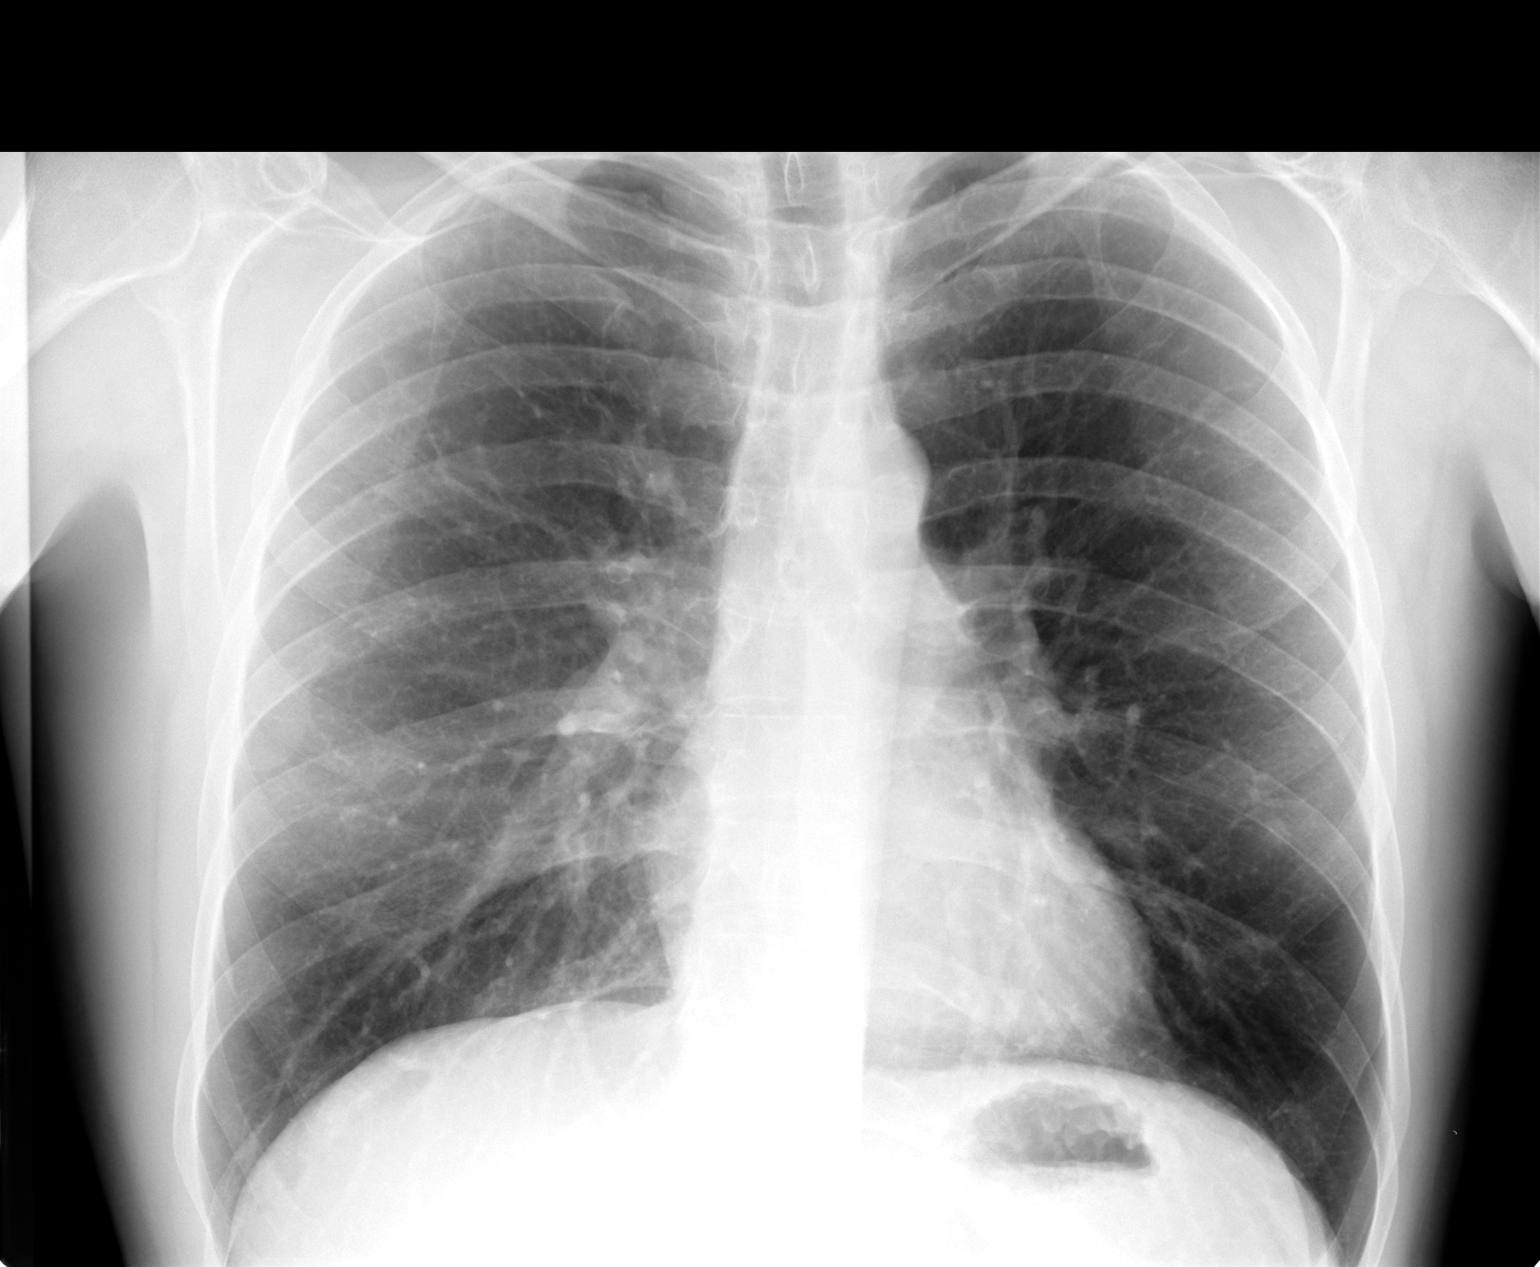

[view not recorded (2 of 2)]
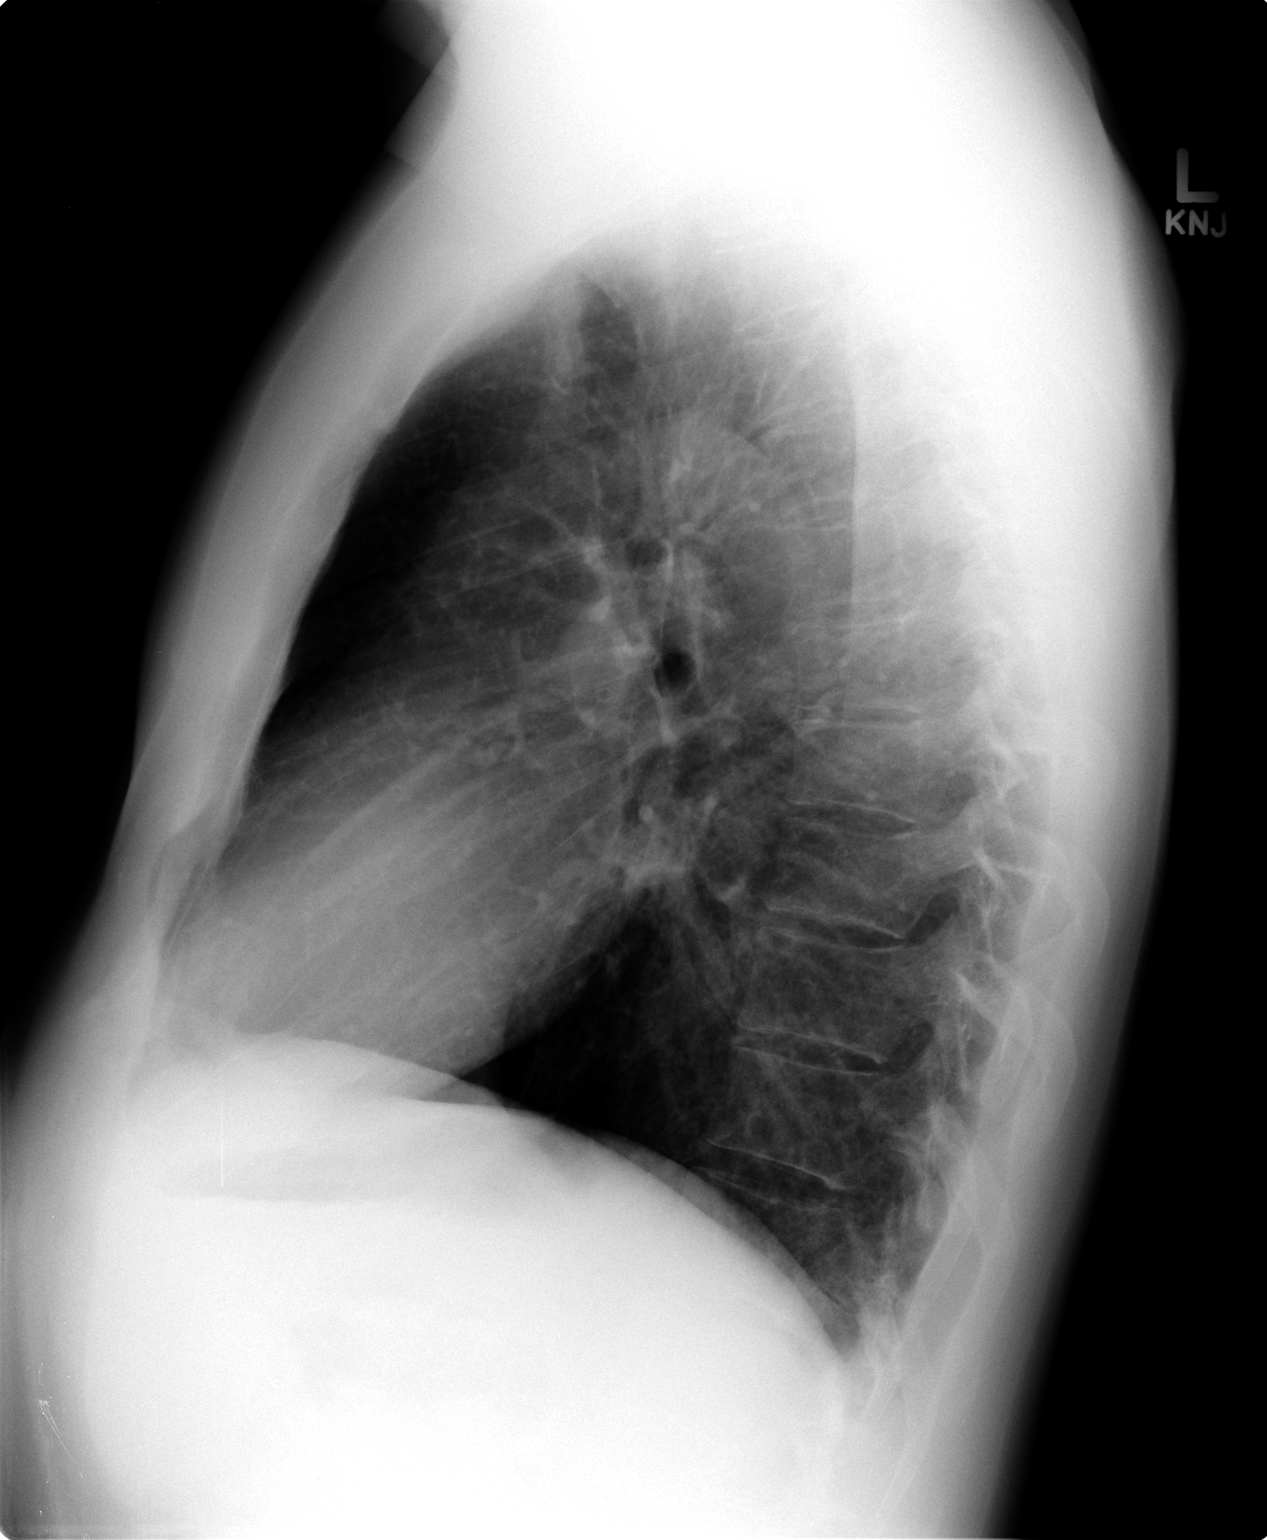

[2 of 2 positions shown; findings below may reference images not displayed]

FINDINGS: The heart size and mediastinal contours are within
normal limits.  Both lungs are clear.  No evidence of pneumothorax
or pleural effusion.  The visualized skeletal structures are
unremarkable.
IMPRESSION: No active cardiopulmonary disease.

## 2013-07-19 ENCOUNTER — Other Ambulatory Visit (INDEPENDENT_AMBULATORY_CARE_PROVIDER_SITE_OTHER): Payer: BC Managed Care – PPO

## 2013-07-19 ENCOUNTER — Ambulatory Visit (INDEPENDENT_AMBULATORY_CARE_PROVIDER_SITE_OTHER): Payer: BC Managed Care – PPO | Admitting: Internal Medicine

## 2013-07-19 ENCOUNTER — Encounter: Payer: Self-pay | Admitting: Internal Medicine

## 2013-07-19 VITALS — BP 128/82 | HR 62 | Temp 97.8°F | Resp 12 | Ht 72.0 in | Wt 161.2 lb

## 2013-07-19 DIAGNOSIS — Z0000 Encounter for general adult medical examination without abnormal findings: Secondary | ICD-10-CM

## 2013-07-19 DIAGNOSIS — N400 Enlarged prostate without lower urinary tract symptoms: Secondary | ICD-10-CM | POA: Insufficient documentation

## 2013-07-19 DIAGNOSIS — J309 Allergic rhinitis, unspecified: Secondary | ICD-10-CM

## 2013-07-19 DIAGNOSIS — E785 Hyperlipidemia, unspecified: Secondary | ICD-10-CM

## 2013-07-19 LAB — LIPID PANEL
CHOLESTEROL: 209 mg/dL — AB (ref 0–200)
HDL: 50.7 mg/dL (ref >39.00)
LDL CALC: 139 mg/dL — AB (ref 0–99)
NonHDL: 158.3
Total CHOL/HDL Ratio: 4
Triglycerides: 97 mg/dL (ref 0.0–149.0)
VLDL: 19.4 mg/dL (ref 0.0–40.0)

## 2013-07-19 LAB — CBC WITH DIFFERENTIAL/PLATELET
BASOS ABS: 0 10*3/uL (ref 0.0–0.1)
Basophils Relative: 0.6 % (ref 0.0–3.0)
Eosinophils Absolute: 0.1 10*3/uL (ref 0.0–0.7)
Eosinophils Relative: 2.5 % (ref 0.0–5.0)
HCT: 44.8 % (ref 39.0–52.0)
Hemoglobin: 14.9 g/dL (ref 13.0–17.0)
LYMPHS ABS: 1.5 10*3/uL (ref 0.7–4.0)
LYMPHS PCT: 33.7 % (ref 12.0–46.0)
MCHC: 33.1 g/dL (ref 30.0–36.0)
MCV: 87.2 fl (ref 78.0–100.0)
MONOS PCT: 8.7 % (ref 3.0–12.0)
Monocytes Absolute: 0.4 10*3/uL (ref 0.1–1.0)
NEUTROS PCT: 54.5 % (ref 43.0–77.0)
Neutro Abs: 2.5 10*3/uL (ref 1.4–7.7)
PLATELETS: 263 10*3/uL (ref 150.0–400.0)
RBC: 5.14 Mil/uL (ref 4.22–5.81)
RDW: 12.6 % (ref 11.5–15.5)
WBC: 4.6 10*3/uL (ref 4.0–10.5)

## 2013-07-19 LAB — BASIC METABOLIC PANEL
BUN: 17 mg/dL (ref 6–23)
CALCIUM: 9.4 mg/dL (ref 8.4–10.5)
CO2: 29 mEq/L (ref 19–32)
CREATININE: 1 mg/dL (ref 0.4–1.5)
Chloride: 104 mEq/L (ref 96–112)
GFR: 87.45 mL/min (ref >60.00)
GLUCOSE: 90 mg/dL (ref 70–99)
Potassium: 4 mEq/L (ref 3.5–5.1)
Sodium: 139 mEq/L (ref 135–145)

## 2013-07-19 LAB — HEPATIC FUNCTION PANEL
ALK PHOS: 99 U/L (ref 39–117)
ALT: 20 U/L (ref 0–53)
AST: 20 U/L (ref 0–37)
Albumin: 4.1 g/dL (ref 3.5–5.2)
BILIRUBIN DIRECT: 0.2 mg/dL (ref 0.0–0.3)
BILIRUBIN TOTAL: 1.1 mg/dL (ref 0.2–1.2)
Total Protein: 7.1 g/dL (ref 6.0–8.3)

## 2013-07-19 LAB — TSH: TSH: 0.56 u[IU]/mL (ref 0.35–4.50)

## 2013-07-19 LAB — PSA: PSA: 0.64 ng/mL (ref 0.10–4.00)

## 2013-07-19 NOTE — Patient Instructions (Signed)
Your next office appointment will be determined based upon review of your pending labs . Those instructions will be transmitted to you through My Chart  OR  by mail;whichever process is your choice to receive results & recommendations . 

## 2013-07-19 NOTE — Progress Notes (Signed)
   Subjective:    Patient ID: Timothy Miles, male    DOB: Aug 09, 1960, 53 y.o.   MRN: [id number]  HPI  He is here for a physical;acute issues intermittent L shoulder impingement syndrome.  A heart healthy diet is followed; exercise encompasses 60 minutes 2  times per week as  running without symptoms.  Family history is negative for premature coronary disease. Advanced cholesterol testing reveals  LDL goal is less than 120 ; ideally <90. . To date no statin.  Low dose ASA not taken   Review of Systems   Specifically denied are  chest pain, palpitations, dyspnea, or claudication.  Significant abdominal symptoms, memory deficit, or myalgias not present.      Objective:   Physical Exam Gen.: Healthy and well-nourished in appearance. Alert, appropriate and cooperative throughout exam. Appears younger than stated age  Head: Normocephalic without obvious abnormalities; no alopecia  Eyes: No corneal or conjunctival inflammation noted. Pupils equal round reactive to light and accommodation. Extraocular motion intact. Fundal exam is benign without hemorrhages, exudate, papilledema.  Vision grossly normal with /w/o lenses Ears: External  ear exam reveals no significant lesions or deformities. Canals clear .TMs normal. Hearing is grossly normal bilaterally. Nose: External nasal exam reveals no deformity or inflammation. Nasal mucosa are pink and moist. No lesions or exudates noted.   Mouth: Oral mucosa and oropharynx reveal no lesions or exudates. Teeth in good repair. Neck: No deformities, masses, or tenderness noted. Range of motion  & Thyroid normal. Lungs: Normal respiratory effort; chest expands symmetrically. Lungs are clear to auscultation without rales, wheezes, or increased work of breathing. Heart: Normal rate and rhythm. Normal S1 and S2. No gallop, click, or rub. S4 w/o murmur. Abdomen: Bowel sounds normal; abdomen soft and nontender. No masses, organomegaly or hernias  noted. Genitalia: Genitalia normal except for left varices. Minor R epididymal granuloma. Prostate is mildly enlarged w/o  asymmetry, nodularity, or induration                                 Musculoskeletal/extremities: No deformity or scoliosis noted of  the thoracic or lumbar spine.   No clubbing, cyanosis, edema, or significant extremity  deformity noted. Range of motion normal .Tone & strength normal. Hand joints normal  Fingernail  health good. Able to lie down & sit up w/o help. Negative SLR bilaterally Vascular: Carotid, radial artery, dorsalis pedis and  posterior tibial pulses are full and equal. No bruits present. Neurologic: Alert and oriented x3. Deep tendon reflexes symmetrical but 0-1/2 + @ knees  Gait normal .      Skin: Intact without suspicious lesions or rashes. Lymph: No cervical, axillary, or inguinal lymphadenopathy present. Psych: Mood and affect are normal. Normally interactive                                                                                        Assessment & Plan:  #1 comprehensive physical exam; no acute findings  Plan: see Orders  & Recommendations

## 2013-07-19 NOTE — Progress Notes (Signed)
Pre visit review using our clinic review tool, if applicable. No additional management support is needed unless otherwise documented below in the visit note. 

## 2013-08-23 ENCOUNTER — Ambulatory Visit (INDEPENDENT_AMBULATORY_CARE_PROVIDER_SITE_OTHER): Payer: BC Managed Care – PPO | Admitting: Internal Medicine

## 2013-08-23 ENCOUNTER — Encounter: Payer: Self-pay | Admitting: Internal Medicine

## 2013-08-23 VITALS — BP 115/62 | HR 72 | Temp 98.3°F | Wt 166.5 lb

## 2013-08-23 DIAGNOSIS — K644 Residual hemorrhoidal skin tags: Secondary | ICD-10-CM

## 2013-08-23 MED ORDER — HYDROCORTISONE ACE-PRAMOXINE 1-1 % RE FOAM: 1.0000 | Freq: Two times a day (BID) | RECTAL | Status: DC

## 2013-08-23 NOTE — Progress Notes (Signed)
   Subjective:    Patient ID: Timothy Miles, male    DOB: 12-23-1960, 52 y.o.   MRN: [id number]  HPI  He was showering when he noted a lump in the perianal area. He denied any associated tenderness or bleeding.  Colonoscopy 2013 was negative.FH neg for colon cancer.  Review of Systems Unexplained weight loss, abdominal pain, significant dyspepsia, dysphagia, melena, rectal bleeding, or persistently small caliber stools are denied.  Dysuria, pyuria, hematuria are denied.     Objective:   Physical Exam  General appearance is one of good health and nourishment w/o distress.  Eyes: No conjunctival inflammation or scleral icterus is present.  Oral exam: Dental hygiene is good; lips and gums are healthy appearing.There is no oropharyngeal erythema or exudate noted.   Heart:  Normal rate and regular rhythm. S1 and S2 normal without gallop, murmur, click, rub or other extra sounds     Lungs:Chest clear to auscultation; no wheezes, rhonchi,rales ,or rubs present.No increased work of breathing.   Abdomen: bowel sounds normal, soft and non-tender without masses, organomegaly or hernias noted.  No guarding or rebound .   Musculoskeletal: Able to lie flat and sit up without help. Strength & tone normal. Gait normal  Skin:Warm & dry.  Intact without suspicious lesions or rashes ; no jaundice or tenting  Lymphatic: No lymphadenopathy is noted about the head, neck, axilla, or inguinal areas.   GU/rectal: Varicele & small granuloma on L.Small external hemorrhoid @ 10 o'clock @ rectum. It is slightly purplish & fluctuant but not tender or bleeding.              Assessment & Plan:  #1 external hemorrhoid See AVS & orders

## 2013-08-23 NOTE — Progress Notes (Signed)
Pre visit review using our clinic review tool, if applicable. No additional management support is needed unless otherwise documented below in the visit note. 

## 2013-08-23 NOTE — Patient Instructions (Signed)
  Cleanse rectal area with lather of mild shampoo in shower  as discussed. After bowel movement,use tissue to cleanse the bulk of stool & finish up with TUCKS  or Baby Wipes.  Sitz baths followed by the  Medication 2 to 3 times a day to shrink the hemorrhoids. Stay well hydrated and avoid popcorn and some other materials which might aggravate hemorrhoids.  

## 2014-05-20 ENCOUNTER — Other Ambulatory Visit: Payer: Self-pay | Admitting: Internal Medicine

## 2014-05-20 ENCOUNTER — Encounter: Payer: Self-pay | Admitting: Internal Medicine

## 2014-05-20 ENCOUNTER — Other Ambulatory Visit (INDEPENDENT_AMBULATORY_CARE_PROVIDER_SITE_OTHER): Payer: 59

## 2014-05-20 ENCOUNTER — Ambulatory Visit (INDEPENDENT_AMBULATORY_CARE_PROVIDER_SITE_OTHER): Payer: 59 | Admitting: Internal Medicine

## 2014-05-20 VITALS — BP 122/86 | HR 64 | Temp 98.0°F | Resp 12 | Ht 72.0 in | Wt 167.5 lb

## 2014-05-20 DIAGNOSIS — E785 Hyperlipidemia, unspecified: Secondary | ICD-10-CM

## 2014-05-20 DIAGNOSIS — Z0000 Encounter for general adult medical examination without abnormal findings: Secondary | ICD-10-CM | POA: Diagnosis not present

## 2014-05-20 DIAGNOSIS — Z0189 Encounter for other specified special examinations: Secondary | ICD-10-CM

## 2014-05-20 DIAGNOSIS — Z52008 Unspecified donor, other blood: Secondary | ICD-10-CM | POA: Insufficient documentation

## 2014-05-20 LAB — CBC WITH DIFFERENTIAL/PLATELET
BASOS PCT: 0.5 % (ref 0.0–3.0)
Basophils Absolute: 0 10*3/uL (ref 0.0–0.1)
EOS PCT: 3.7 % (ref 0.0–5.0)
Eosinophils Absolute: 0.2 10*3/uL (ref 0.0–0.7)
HEMATOCRIT: 44.8 % (ref 39.0–52.0)
Hemoglobin: 15.2 g/dL (ref 13.0–17.0)
LYMPHS ABS: 1.3 10*3/uL (ref 0.7–4.0)
Lymphocytes Relative: 27.6 % (ref 12.0–46.0)
MCHC: 34 g/dL (ref 30.0–36.0)
MCV: 82.3 fl (ref 78.0–100.0)
MONO ABS: 0.4 10*3/uL (ref 0.1–1.0)
Monocytes Relative: 7.7 % (ref 3.0–12.0)
NEUTROS ABS: 2.9 10*3/uL (ref 1.4–7.7)
NEUTROS PCT: 60.5 % (ref 43.0–77.0)
PLATELETS: 287 10*3/uL (ref 150.0–400.0)
RBC: 5.45 Mil/uL (ref 4.22–5.81)
RDW: 12.8 % (ref 11.5–15.5)
WBC: 4.8 10*3/uL (ref 4.0–10.5)

## 2014-05-20 LAB — HEPATIC FUNCTION PANEL
ALBUMIN: 4.1 g/dL (ref 3.5–5.2)
ALK PHOS: 109 U/L (ref 39–117)
ALT: 17 U/L (ref 0–53)
AST: 17 U/L (ref 0–37)
BILIRUBIN DIRECT: 0.1 mg/dL (ref 0.0–0.3)
BILIRUBIN TOTAL: 0.7 mg/dL (ref 0.2–1.2)
Total Protein: 7 g/dL (ref 6.0–8.3)

## 2014-05-20 LAB — BASIC METABOLIC PANEL
BUN: 16 mg/dL (ref 6–23)
CHLORIDE: 105 meq/L (ref 96–112)
CO2: 29 meq/L (ref 19–32)
CREATININE: 1.05 mg/dL (ref 0.40–1.50)
Calcium: 9.3 mg/dL (ref 8.4–10.5)
GFR: 78.6 mL/min (ref >60.00)
Glucose, Bld: 89 mg/dL (ref 70–99)
Potassium: 4.7 mEq/L (ref 3.5–5.1)
Sodium: 140 mEq/L (ref 135–145)

## 2014-05-20 LAB — TSH: TSH: 0.56 u[IU]/mL (ref 0.35–4.50)

## 2014-05-20 LAB — PSA: PSA: 0.57 ng/mL (ref 0.10–4.00)

## 2014-05-20 NOTE — Progress Notes (Signed)
Pre visit review using our clinic review tool, if applicable. No additional management support is needed unless otherwise documented below in the visit note. 

## 2014-05-20 NOTE — Progress Notes (Signed)
   Subjective:    Patient ID: Timothy Miles, male    DOB: [birth year], 54 y.o.   MRN: [id number]  HPI  He is here for a physical;acute issues denied.  He is on a heart healthy diet. His recording device indicates that he walks at least 2 miles per day. He ran a half marathon last fall. His exercise has decreased the last 2 months but he still has no cardiopulmonary symptoms with exercise.  There is no family history of premature heart attack or stroke. Advanced testing revealed his LDL goal is less than 120, ideally less than 90. Last year his LDL was 139.  Colonoscopy was negative in 2013. He has no active GI symptoms.   Review of Systems   Chest pain, palpitations, tachycardia, exertional dyspnea, paroxysmal nocturnal dyspnea, claudication or edema are absent.  Unexplained weight loss, abdominal pain, significant dyspepsia, dysphagia, melena, rectal bleeding, or persistently small caliber stools are denied.     Objective:   Physical Exam Gen.: Thin but adequately nourished in appearance. Alert, appropriate and cooperative throughout exam.  Appears younger than stated age  Head: Normocephalic without obvious abnormalities;  no alopecia  Eyes: No corneal or conjunctival inflammation noted. Pupils equal round reactive to light and accommodation. Extraocular motion intact.  Ears: External  ear exam reveals no significant lesions or deformities. Canals clear .TMs normal. Hearing is grossly normal bilaterally. Nose: External nasal exam reveals no deformity or inflammation. Nasal mucosa are pink and moist. No lesions or exudates noted.   Mouth: Oral mucosa and oropharynx reveal no lesions or exudates. Teeth in good repair. Neck: No deformities, masses, or tenderness noted. Range of motion &. Thyroid normal. Lungs: Normal respiratory effort; chest expands symmetrically. Lungs are clear to auscultation without rales, wheezes, or increased work of breathing. Heart: Normal rate and rhythm.  Normal S1 and S2. No gallop, click, or rub. No murmur. Abdomen: Bowel sounds normal; abdomen soft and nontender. No masses, organomegaly or hernias noted. Genitalia: Genitalia normal except for left varices. Prostate is not enlarged but is asymmetric. The left lobe is larger than the right. It is smooth without nodularity or induration. Musculoskeletal/extremities: No deformity or scoliosis noted of  the thoracic or lumbar spine.  No clubbing, cyanosis, edema, or significant extremity  deformity noted.  Range of motion normal . Tone & strength normal. Hand joints normal  Fingernail  health good. Crepitus of knees  Able to lie down & sit up w/o help.  Negative SLR bilaterally Vascular: Carotid, radial artery, dorsalis pedis and  posterior tibial pulses are full and equal. No bruits present. Neurologic: Alert and oriented x3. Deep tendon reflexes symmetrical and normal.  Gait normal    Skin: Intact without suspicious lesions or rashes. Lymph: No cervical, axillary, or inguinal lymphadenopathy present. Psych: Mood and affect are normal. Normally interactive                                                                                        Assessment & Plan:  #1 comprehensive physical exam; no acute findings  Plan: see Orders  & Recommendations

## 2014-05-20 NOTE — Patient Instructions (Signed)
Plain Mucinex (NOT D) for thick secretions ;force NON dairy fluids .   Nasal cleansing in the shower as discussed with lather of mild shampoo.After 10 seconds wash off lather while  exhaling through nostrils. Make sure that all residual soap is removed to prevent irritation.  Flonase OR Nasacort AQ 1 spray in each nostril twice a day as needed. Use the "crossover" technique into opposite nostril spraying toward opposite ear @ 45 degree angle, not straight up into nostril.  Plain Allegra (NOT D )  160 daily , Loratidine 10 mg , OR Zyrtec 10 mg @ bedtime  as needed for itchy eyes & sneezing. Your next office appointment will be determined based upon review of your pending labs  and  xrays  Those instructions will be transmitted to you by My Chart Critical results will be called.   Followup as needed for any active or acute issue. Please report any significant change in your symptoms.

## 2014-05-25 LAB — NMR LIPOPROFILE WITH LIPIDS
Cholesterol, Total: 174 mg/dL (ref 100–199)
HDL PARTICLE NUMBER: 28.5 umol/L — AB (ref >=30.5)
HDL Size: 8.7 nm — ABNORMAL LOW (ref >=9.2)
HDL-C: 52 mg/dL (ref >39)
LDL (calc): 101 mg/dL — ABNORMAL HIGH (ref 0–99)
LDL Particle Number: 1178 nmol/L — ABNORMAL HIGH (ref <1000)
LDL SIZE: 21.2 nm (ref >=20.8)
LP-IR SCORE: 35 (ref <=45)
Large HDL-P: 4 umol/L — ABNORMAL LOW (ref >=4.8)
Large VLDL-P: <0.8 nmol/L (ref <=2.7)
SMALL LDL PARTICLE NUMBER: 344 nmol/L (ref <=527)
Triglycerides: 104 mg/dL (ref 0–149)
VLDL SIZE: 42.6 nm (ref <=46.6)
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
## 2014-11-22 ENCOUNTER — Telehealth: Payer: Self-pay | Admitting: Internal Medicine

## 2014-11-22 NOTE — Telephone Encounter (Signed)
Received call from patient and [employer].   DOS 05/20/2014 for labs  I am being told that we sent   -procedue code [phone], [phone] For the lipid panels. We did not mark these as preventative , like we did with the rest of the lab. The patient believes that this is incorrect. Is it possible to amend the lab orders for this day to show the same Z01.89 dx (preventative)?

## 2014-11-23 NOTE — Telephone Encounter (Signed)
Dawn, this was ordered @ [facility] and was preventive.Can this be changed as requested ? Thanks, [employer]

## 2014-11-28 NOTE — Telephone Encounter (Signed)
Hi Dr. Linna Darner,  I am unable to see what diagnosis was used by the lab since this was sent to outside lab(could not see dx on the order), however, the patient has been diagnosed in the past with Hyperlipidemia, so the appropriate diagnosis would not be the Z00.00.  It would be the Hyperlipidemia.  Thanks, [employer]

## 2014-11-28 NOTE — Telephone Encounter (Signed)
It was part of [facility] to optimally assess risk

## 2014-11-29 ENCOUNTER — Encounter: Payer: Self-pay | Admitting: Internal Medicine

## 2014-11-29 NOTE — Telephone Encounter (Signed)
Please insurance coverage issues for NMR and letter

## 2014-11-29 NOTE — Telephone Encounter (Signed)
Again, since he has been diagnosed and has Hyperlipidemia this is the diagnosis that should be used, it is not a screening for Hyperlipidemia.  Looking at the [facility] Preventive guidelines, code [phone] does not state that it is a covered preventive screening anyhow, so I do not think that Z00.00 would be covered with this lab test.  Also, since this is an outside lab(not [facility]) I can not see what diagnosis was used and have no power to change codes, so I cannot send email to have changed.  Thanks, [employer]

## 2014-11-30 ENCOUNTER — Telehealth: Payer: Self-pay | Admitting: Emergency Medicine

## 2014-11-30 NOTE — Telephone Encounter (Signed)
Letter has been mailed to pt and insurance company.

## 2014-11-30 NOTE — Telephone Encounter (Signed)
Letter sent to patient and insurance comp.

## 2014-12-30 ENCOUNTER — Telehealth: Payer: Self-pay | Admitting: Internal Medicine

## 2014-12-30 NOTE — Telephone Encounter (Signed)
Pt called stated he need help with the bill, pt came in for a physical 05/20/14 and received a bill. He spoke with the billing and they told him the code was wrong and he need to speak to our office to get this fix. Please help, pt has been trying to get this solved for a while now. His spouse, Dee Steeno [birth year]17[birth year]62, has the same problem with the billing. Can we check and call him back

## 2015-07-06 ENCOUNTER — Encounter: Payer: Self-pay | Admitting: Internal Medicine

## 2015-09-21 ENCOUNTER — Encounter: Payer: Self-pay | Admitting: Internal Medicine

## 2015-09-21 NOTE — Patient Instructions (Addendum)
Test(s) ordered today. Your results will be released to MyChart (or called to you) after review, usually within 72hours after test completion. If any changes need to be made, you will be notified at that same time.  All other Health Maintenance issues reviewed.   All recommended immunizations and age-appropriate screenings are up-to-date or discussed.  No immunizations administered today.   Medications reviewed and updated.  No changes recommended at this time.    Please followup in one year  Health Maintenance, Male A healthy lifestyle and preventative care can promote health and wellness.  Maintain regular health, dental, and eye exams.  Eat a healthy diet. Foods like vegetables, fruits, whole grains, low-fat dairy products, and lean protein foods contain the nutrients you need and are low in calories. Decrease your intake of foods high in solid fats, added sugars, and salt. Get information about a proper diet from your health care provider, if necessary.  Regular physical exercise is one of the most important things you can do for your health. Most adults should get at least 150 minutes of moderate-intensity exercise (any activity that increases your heart rate and causes you to sweat) each week. In addition, most adults need muscle-strengthening exercises on 2 or more days a week.   Maintain a healthy weight. The body mass index (BMI) is a screening tool to identify possible weight problems. It provides an estimate of body fat based on height and weight. Your health care provider can find your BMI and can help you achieve or maintain a healthy weight. For males 20 years and older:  A BMI below 18.5 is considered underweight.  A BMI of 18.5 to 24.9 is normal.  A BMI of 25 to 29.9 is considered overweight.  A BMI of 30 and above is considered obese.  Maintain normal blood lipids and cholesterol by exercising and minimizing your intake of saturated fat. Eat a balanced diet with  plenty of fruits and vegetables. Blood tests for lipids and cholesterol should begin at age 20 and be repeated every 5 years. If your lipid or cholesterol levels are high, you are over age 50, or you are at high risk for heart disease, you may need your cholesterol levels checked more frequently.Ongoing high lipid and cholesterol levels should be treated with medicines if diet and exercise are not working.  If you smoke, find out from your health care provider how to quit. If you do not use tobacco, do not start.  Lung cancer screening is recommended for adults aged 55-80 years who are at high risk for developing lung cancer because of a history of smoking. A yearly low-dose CT scan of the lungs is recommended for people who have at least a 30-pack-year history of smoking and are current smokers or have quit within the past 15 years. A pack year of smoking is smoking an average of 1 pack of cigarettes a day for 1 year (for example, a 30-pack-year history of smoking could mean smoking 1 pack a day for 30 years or 2 packs a day for 15 years). Yearly screening should continue until the smoker has stopped smoking for at least 15 years. Yearly screening should be stopped for people who develop a health problem that would prevent them from having lung cancer treatment.  If you choose to drink alcohol, do not have more than 2 drinks per day. One drink is considered to be 12 oz (360 mL) of beer, 5 oz (150 mL) of wine, or 1.5 oz (45   mL) of liquor.  Avoid the use of street drugs. Do not share needles with anyone. Ask for help if you need support or instructions about stopping the use of drugs.  High blood pressure causes heart disease and increases the risk of stroke. High blood pressure is more likely to develop in:  People who have blood pressure in the end of the normal range (100-139/85-89 mm Hg).  People who are overweight or obese.  People who are African American.  If you are 18-39 years of age, have  your blood pressure checked every 3-5 years. If you are 40 years of age or older, have your blood pressure checked every year. You should have your blood pressure measured twice--once when you are at a hospital or clinic, and once when you are not at a hospital or clinic. Record the average of the two measurements. To check your blood pressure when you are not at a hospital or clinic, you can use:  An automated blood pressure machine at a pharmacy.  A home blood pressure monitor.  If you are 45-79 years old, ask your health care provider if you should take aspirin to prevent heart disease.  Diabetes screening involves taking a blood sample to check your fasting blood sugar level. This should be done once every 3 years after age 45 if you are at a normal weight and without risk factors for diabetes. Testing should be considered at a younger age or be carried out more frequently if you are overweight and have at least 1 risk factor for diabetes.  Colorectal cancer can be detected and often prevented. Most routine colorectal cancer screening begins at the age of 50 and continues through age 75. However, your health care provider may recommend screening at an earlier age if you have risk factors for colon cancer. On a yearly basis, your health care provider may provide home test kits to check for hidden blood in the stool. A small camera at the end of a tube may be used to directly examine the colon (sigmoidoscopy or colonoscopy) to detect the earliest forms of colorectal cancer. Talk to your health care provider about this at age 50 when routine screening begins. A direct exam of the colon should be repeated every 5-10 years through age 75, unless early forms of precancerous polyps or small growths are found.  People who are at an increased risk for hepatitis B should be screened for this virus. You are considered at high risk for hepatitis B if:  You were born in a country where hepatitis B occurs often.  Talk with your health care provider about which countries are considered high risk.  Your parents were born in a high-risk country and you have not received a shot to protect against hepatitis B (hepatitis B vaccine).  You have HIV or AIDS.  You use needles to inject street drugs.  You live with, or have sex with, someone who has hepatitis B.  You are a man who has sex with other men (MSM).  You get hemodialysis treatment.  You take certain medicines for conditions like cancer, organ transplantation, and autoimmune conditions.  Hepatitis C blood testing is recommended for all people born from 1945 through 1965 and any individual with known risk factors for hepatitis C.  Healthy men should no longer receive prostate-specific antigen (PSA) blood tests as part of routine cancer screening. Talk to your health care provider about prostate cancer screening.  Testicular cancer screening is not recommended for adolescents   or adult males who have no symptoms. Screening includes self-exam, a health care provider exam, and other screening tests. Consult with your health care provider about any symptoms you have or any concerns you have about testicular cancer.  Practice safe sex. Use condoms and avoid high-risk sexual practices to reduce the spread of sexually transmitted infections (STIs).  You should be screened for STIs, including gonorrhea and chlamydia if:  You are sexually active and are younger than 24 years.  You are older than 24 years, and your health care provider tells you that you are at risk for this type of infection.  Your sexual activity has changed since you were last screened, and you are at an increased risk for chlamydia or gonorrhea. Ask your health care provider if you are at risk.  If you are at risk of being infected with HIV, it is recommended that you take a prescription medicine daily to prevent HIV infection. This is called pre-exposure prophylaxis (PrEP). You are  considered at risk if:  You are a man who has sex with other men (MSM).  You are a heterosexual man who is sexually active with multiple partners.  You take drugs by injection.  You are sexually active with a partner who has HIV.  Talk with your health care provider about whether you are at high risk of being infected with HIV. If you choose to begin PrEP, you should first be tested for HIV. You should then be tested every 3 months for as long as you are taking PrEP.  Use sunscreen. Apply sunscreen liberally and repeatedly throughout the day. You should seek shade when your shadow is shorter than you. Protect yourself by wearing long sleeves, pants, a wide-brimmed hat, and sunglasses year round whenever you are outdoors.  Tell your health care provider of new moles or changes in moles, especially if there is a change in shape or color. Also, tell your health care provider if a mole is larger than the size of a pencil eraser.  A one-time screening for abdominal aortic aneurysm (AAA) and surgical repair of large AAAs by ultrasound is recommended for men aged 65-75 years who are current or former smokers.  Stay current with your vaccines (immunizations).   This information is not intended to replace advice given to you by your health care provider. Make sure you discuss any questions you have with your health care provider.   Document Released: 06/29/2007 Document Revised: 01/21/2014 Document Reviewed: 05/28/2010 Elsevier Interactive Patient Education 2016 [employer] Inc.  

## 2015-09-21 NOTE — Progress Notes (Signed)
Subjective:    Patient ID: Timothy Miles, male    DOB: 01/02/1961, 55 y.o.   MRN: [id number]  HPI He is here to establish with a new pcp.   He is here for a physical exam.   He runs regularly.  He eats very healthy.    Medications and allergies reviewed with patient and updated if appropriate.  Patient Active Problem List   Diagnosis Date Noted  . 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 BPH (benign prostatic hyperplasia) 07/19/2013  . Hyperlipidemia 02/04/2012  . NEPHROLITHIASIS, HX OF 04/26/2009  . ALLERGIC RHINITIS 03/09/2007  . ALKALINE PHOSPHATASE, ELEVATED 03/09/2007    Current Outpatient Prescriptions on File Prior to Visit  Medication Sig Dispense Refill  . loratadine (CLARITIN) 10 MG tablet Take 10 mg by mouth daily as needed.      No current facility-administered medications on file prior to visit.     Past Medical History:  Diagnosis Date  . Allergic rhinitis, seasonal    mainly Spring  . Blood donor   . Elevated alkaline phosphatase level   . Premature ventricular contractions   . Renal calculus 1999    Past Surgical History:  Procedure Laterality Date  . COLONOSCOPY  2013   negative  . renal calculus OP  1999   passed spontaneously  . WISDOM TOOTH EXTRACTION    . WRIST FRACTURE SURGERY  1995   Left wrist    Social History   Social History  . Marital status: Married    Spouse name: N/A  . Number of children: N/A  . Years of education: N/A   Social History Main Topics  . Smoking status: Never Smoker  . Smokeless tobacco: None  . Alcohol use 0.6 oz/week    1 Glasses of wine per week     Comment: Social  . Drug use: No  . Sexual activity: Not Asked   Other Topics Concern  . None   Social History Narrative   No diet; no caffeine   Married   Regular exercise- yes: 3 hrs of CVE and weights    Family History  Problem Relation Age of Onset  . Melanoma Father     intranasally  . Skin cancer Mother     Basal cell  . Diabetes Brother     diet  controlled  . Lung cancer Maternal Grandfather     smoker  . Angelman syndrome Daughter     15th Chromosome partial deletion  . Stroke Neg Hx   . Heart disease Neg Hx   . Colon cancer Neg Hx     Review of Systems  Constitutional: Negative for appetite change, chills, fatigue, fever and unexpected weight change.  HENT: Negative for hearing loss.   Eyes: Negative for visual disturbance.  Respiratory: Negative for cough, shortness of breath and wheezing.   Cardiovascular: Negative for chest pain, palpitations and leg swelling.  Gastrointestinal: Negative for abdominal pain, blood in stool, constipation, diarrhea and nausea.       No gerd  Genitourinary: Negative for difficulty urinating, dysuria and hematuria.  Musculoskeletal: Negative for arthralgias and back pain.  Skin: Negative for color change and rash.  Neurological: Negative for dizziness, light-headedness and headaches.  Psychiatric/Behavioral: Negative for dysphoric mood. The patient is not nervous/anxious.        Objective:   Vitals:   09/22/15 0803  BP: 132/84  Pulse: 67  Resp: 16  Temp: 97.7 F (36.5 C)   Filed Weights   09/22/15 [id number]  Weight: 159 lb (72.1 kg)   Body mass index is 21.56 kg/m.   Physical Exam Constitutional: He appears well-developed and well-nourished. No distress.  HENT:  Head: Normocephalic and atraumatic.  Right Ear: External ear normal.  Left Ear: External ear normal.  Mouth/Throat: Oropharynx is clear and moist.  Normal ear canals and TM b/l  Eyes: Conjunctivae and EOM are normal.  Neck: Neck supple. No tracheal deviation present. No thyromegaly present.  No carotid bruit  Cardiovascular: Normal rate, regular rhythm, normal heart sounds and intact distal pulses.   No murmur heard. Pulmonary/Chest: Effort normal and breath sounds normal. No respiratory distress. He has no wheezes. He has no rales.  Abdominal: Soft. Bowel sounds are normal. He exhibits no distension. There is no  tenderness.  Genitourinary: normal prostate size, no nodules Musculoskeletal: He exhibits no edema.  Lymphadenopathy:    He has no cervical adenopathy.  Skin: Skin is warm and dry. He is not diaphoretic.  Psychiatric: He has a normal mood and affect. His behavior is normal.         Assessment & Plan:   Physical exam: Screening blood work  ordered Immunizations  Flu vaccine - he will get it at work, tetanus up to date Colonoscopy  Up to date  EKG   - not indicated  Exercise - regular  - running Weight - normal BMI Skin - sees derm every year Substance abuse    none  See Problem List for Assessment and Plan of chronic medical problems.   Follow up annually

## 2015-09-22 ENCOUNTER — Encounter: Payer: Self-pay | Admitting: Internal Medicine

## 2015-09-22 ENCOUNTER — Other Ambulatory Visit (INDEPENDENT_AMBULATORY_CARE_PROVIDER_SITE_OTHER): Payer: 59

## 2015-09-22 ENCOUNTER — Ambulatory Visit (INDEPENDENT_AMBULATORY_CARE_PROVIDER_SITE_OTHER): Payer: 59 | Admitting: Internal Medicine

## 2015-09-22 VITALS — BP 132/84 | HR 67 | Temp 97.7°F | Resp 16 | Ht 72.0 in | Wt 159.0 lb

## 2015-09-22 DIAGNOSIS — Z1159 Encounter for screening for other viral diseases: Secondary | ICD-10-CM

## 2015-09-22 DIAGNOSIS — Z0000 Encounter for general adult medical examination without abnormal findings: Secondary | ICD-10-CM

## 2015-09-22 DIAGNOSIS — Z114 Encounter for screening for human immunodeficiency virus [HIV]: Secondary | ICD-10-CM

## 2015-09-22 DIAGNOSIS — L4050 Arthropathic psoriasis, unspecified: Secondary | ICD-10-CM | POA: Diagnosis not present

## 2015-09-22 DIAGNOSIS — J3089 Other allergic rhinitis: Secondary | ICD-10-CM | POA: Diagnosis not present

## 2015-09-22 LAB — LIPID PANEL
CHOL/HDL RATIO: 4
Cholesterol: 185 mg/dL (ref 0–200)
HDL: 47.7 mg/dL (ref >39.00)
LDL CALC: 120 mg/dL — AB (ref 0–99)
NonHDL: 136.93
TRIGLYCERIDES: 83 mg/dL (ref 0.0–149.0)
VLDL: 16.6 mg/dL (ref 0.0–40.0)

## 2015-09-22 LAB — COMPREHENSIVE METABOLIC PANEL
ALT: 14 U/L (ref 0–53)
AST: 17 U/L (ref 0–37)
Albumin: 4.3 g/dL (ref 3.5–5.2)
Alkaline Phosphatase: 109 U/L (ref 39–117)
BILIRUBIN TOTAL: 0.9 mg/dL (ref 0.2–1.2)
BUN: 19 mg/dL (ref 6–23)
CHLORIDE: 103 meq/L (ref 96–112)
CO2: 30 meq/L (ref 19–32)
CREATININE: 1.05 mg/dL (ref 0.40–1.50)
Calcium: 9.3 mg/dL (ref 8.4–10.5)
GFR: 78.2 mL/min (ref >60.00)
Glucose, Bld: 90 mg/dL (ref 70–99)
Potassium: 4.5 mEq/L (ref 3.5–5.1)
SODIUM: 140 meq/L (ref 135–145)
Total Protein: 7.1 g/dL (ref 6.0–8.3)

## 2015-09-22 LAB — CBC WITH DIFFERENTIAL/PLATELET
BASOS ABS: 0 10*3/uL (ref 0.0–0.1)
BASOS PCT: 0.4 % (ref 0.0–3.0)
Eosinophils Absolute: 0.2 10*3/uL (ref 0.0–0.7)
Eosinophils Relative: 5 % (ref 0.0–5.0)
HCT: 46.9 % (ref 39.0–52.0)
Hemoglobin: 16.2 g/dL (ref 13.0–17.0)
Lymphocytes Relative: 30.1 % (ref 12.0–46.0)
Lymphs Abs: 1.4 10*3/uL (ref 0.7–4.0)
MCHC: 34.6 g/dL (ref 30.0–36.0)
MCV: 82.2 fl (ref 78.0–100.0)
MONO ABS: 0.4 10*3/uL (ref 0.1–1.0)
Monocytes Relative: 9.2 % (ref 3.0–12.0)
NEUTROS ABS: 2.5 10*3/uL (ref 1.4–7.7)
Neutrophils Relative %: 55.3 % (ref 43.0–77.0)
PLATELETS: 282 10*3/uL (ref 150.0–400.0)
RBC: 5.71 Mil/uL (ref 4.22–5.81)
RDW: 12.7 % (ref 11.5–15.5)
WBC: 4.5 10*3/uL (ref 4.0–10.5)

## 2015-09-22 LAB — TSH: TSH: 0.71 u[IU]/mL (ref 0.35–4.50)

## 2015-09-22 LAB — HEPATITIS C ANTIBODY: HCV Ab: NEGATIVE

## 2015-09-22 LAB — HIV ANTIBODY (ROUTINE TESTING W REFLEX): HIV 1&2 Ab, 4th Generation: NONREACTIVE

## 2015-09-22 LAB — PSA: PSA: 1.2 ng/mL (ref 0.10–4.00)

## 2015-09-22 NOTE — Progress Notes (Signed)
Pre visit review using our clinic review tool, if applicable. No additional management support is needed unless otherwise documented below in the visit note. 

## 2015-09-22 NOTE — Assessment & Plan Note (Signed)
Controlled with claritin as needed

## 2015-09-24 ENCOUNTER — Encounter: Payer: Self-pay | Admitting: Internal Medicine

## 2016-08-19 DIAGNOSIS — L814 Other melanin hyperpigmentation: Secondary | ICD-10-CM | POA: Diagnosis not present

## 2016-08-19 DIAGNOSIS — D225 Melanocytic nevi of trunk: Secondary | ICD-10-CM | POA: Diagnosis not present

## 2016-08-19 DIAGNOSIS — D1801 Hemangioma of skin and subcutaneous tissue: Secondary | ICD-10-CM | POA: Diagnosis not present

## 2016-09-21 NOTE — Progress Notes (Signed)
Subjective:    Patient ID: Timothy Miles, male    DOB: 10-26-1960, 56 y.o.   MRN: [id number]  HPI He is here for a physical exam.   He has no concerns.  He denies changes in his health.  Medications and allergies reviewed with patient and updated if appropriate.  Patient Active Problem List   Diagnosis Date Noted  . 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 BPH (benign prostatic hyperplasia) 07/19/2013  . Hyperlipidemia 02/04/2012  . NEPHROLITHIASIS, HX OF 04/26/2009  . Allergic rhinitis 03/09/2007    Current Outpatient Prescriptions on File Prior to Visit  Medication Sig Dispense Refill  . loratadine (CLARITIN) 10 MG tablet Take 10 mg by mouth daily as needed.      No current facility-administered medications on file prior to visit.     Past Medical History:  Diagnosis Date  . Allergic rhinitis, seasonal    mainly Spring  . Blood donor   . Elevated alkaline phosphatase level   . Premature ventricular contractions   . Renal calculus 1999    Past Surgical History:  Procedure Laterality Date  . COLONOSCOPY  2013   negative  . renal calculus OP  1999   passed spontaneously  . WISDOM TOOTH EXTRACTION    . WRIST FRACTURE SURGERY  1995   Left wrist    Social History   Social History  . Marital status: Married    Spouse name: N/A  . Number of children: N/A  . Years of education: N/A   Social History Main Topics  . Smoking status: Never Smoker  . Smokeless tobacco: Never Used  . Alcohol use 0.6 oz/week    1 Glasses of wine per week     Comment: Social  . Drug use: No  . Sexual activity: Not Asked   Other Topics Concern  . None   Social History Narrative   No diet; no caffeine   Married   Regular exercise- yes: 3 hrs of CVE and weights    Family History  Problem Relation Age of Onset  . Melanoma Father        intranasally  . Skin cancer Mother        Timothy Miles  . Diabetes Brother        diet controlled  . Lung cancer Maternal Grandfather        smoker    . Timothy Miles        15th Chromosome partial deletion  . Stroke Neg Hx   . Heart disease Neg Hx   . Colon cancer Neg Hx     Review of Systems  Constitutional: Negative for appetite change, chills, fatigue and fever.  Eyes: Negative for visual disturbance.  Respiratory: Negative for cough, shortness of breath and wheezing.   Cardiovascular: Negative for chest pain, palpitations and leg swelling.  Gastrointestinal: Negative for abdominal pain, blood in stool, constipation, diarrhea and nausea.       No gerd  Genitourinary: Negative for difficulty urinating, dysuria and hematuria.  Musculoskeletal: Negative for arthralgias and back pain.  Skin: Negative for color change and rash.  Neurological: Negative for dizziness, light-headedness and headaches.  Psychiatric/Behavioral: Negative for dysphoric mood. The patient is not nervous/anxious.        Objective:   Vitals:   09/23/16 0809  BP: 102/72  Pulse: 65  Resp: 16  Temp: 98.7 F (37.1 C)  SpO2: 97%   Filed Weights   09/23/16 0809  Weight: 164 lb (74.4 kg)  Body mass index is 22.24 kg/m.  Wt Readings from Last 3 Encounters:  09/23/16 164 lb (74.4 kg)  09/22/15 159 lb (72.1 kg)  05/20/14 167 lb 8 oz (76 kg)     Physical Exam Constitutional: He appears well-developed and well-nourished. No distress.  HENT:  Head: Normocephalic and atraumatic.  Right Ear: External ear normal.  Left Ear: External ear normal.  Mouth/Throat: Oropharynx is clear and moist.  Normal ear canals and TM b/l  Eyes: Conjunctivae and EOM are normal.  Neck: Neck supple. No tracheal deviation present. No thyromegaly present.  No carotid bruit  Cardiovascular: Normal rate, regular rhythm, normal heart sounds and intact distal pulses.   No murmur heard. Pulmonary/Chest: Effort normal and breath sounds normal. No respiratory distress. He has no wheezes. He has no rales.  Abdominal: Soft. He exhibits no distension. There is no  tenderness.  Genitourinary: normal prostate without nodules Musculoskeletal: He exhibits no edema.  Lymphadenopathy:   He has no cervical adenopathy.  Skin: Skin is warm and dry. He is not diaphoretic.  Psychiatric: He has a normal mood and affect. His behavior is normal.         Assessment & Plan:   Physical exam: Screening blood work  ordered Immunizations  Flu vaccine due,  Td up to date Colonoscopy  Up to date  Eye exams   .Up to date  EKG   Last EKG 2014   - repeat today for a baseline - NSR at 64 bpm, normal EKG, no change from prior in 2014 Exercise  - running regularly Weight   BMI normal Skin  No concerns, sees derm annually - father had melanoma Substance abuse   none  See Problem List for Assessment and Plan of chronic medical problems.   FU in one year

## 2016-09-21 NOTE — Patient Instructions (Addendum)
Test(s) ordered today. Your results will be released to [place] (or called to you) after review, usually within 72hours after test completion. If any changes need to be made, you will be notified at that same time.  All other Health Maintenance issues reviewed.   All recommended immunizations and age-appropriate screenings are up-to-date or discussed.  No immunizations administered today.  An EKG was done today.   Medications reviewed and updated.  No changes recommended at this time.   Please followup in one year     Health Maintenance, Male A healthy lifestyle and preventive care is important for your health and wellness. Ask your health care provider about what schedule of regular examinations is right for you. What should I know about weight and diet? Eat a Healthy Diet  Eat plenty of vegetables, fruits, whole grains, low-fat dairy products, and lean protein.  Do not eat a lot of foods high in solid fats, added sugars, or salt.  Maintain a Healthy Weight Regular exercise can help you achieve or maintain a healthy weight. You should:  Do at least 150 minutes of exercise each week. The exercise should increase your heart rate and make you sweat (moderate-intensity exercise).  Do strength-training exercises at least twice a week.  Watch Your Levels of Cholesterol and Blood Lipids  Have your blood tested for lipids and cholesterol every 5 years starting at 55 years of age. If you are at high risk for heart disease, you should start having your blood tested when you are 55 years old. You may need to have your cholesterol levels checked more often if: ? Your lipid or cholesterol levels are high. ? You are older than 55 years of age. ? You are at high risk for heart disease.  What should I know about cancer screening? Many types of cancers can be detected early and may often be prevented. Lung Cancer  You should be screened every year for lung cancer if: ? You are a current  smoker who has smoked for at least 30 years. ? You are a former smoker who has quit within the past 15 years.  Talk to your health care provider about your screening options, when you should start screening, and how often you should be screened.  Colorectal Cancer  Routine colorectal cancer screening usually begins at 55 years of age and should be repeated every 5-10 years until you are 55 years old. You may need to be screened more often if early forms of precancerous polyps or small growths are found. Your health care provider may recommend screening at an earlier age if you have risk factors for colon cancer.  Your health care provider may recommend using home test kits to check for hidden blood in the stool.  A small camera at the end of a tube can be used to examine your colon (sigmoidoscopy or colonoscopy). This checks for the earliest forms of colorectal cancer.  Prostate and Testicular Cancer  Depending on your age and overall health, your health care provider may do certain tests to screen for prostate and testicular cancer.  Talk to your health care provider about any symptoms or concerns you have about testicular or prostate cancer.  Skin Cancer  Check your skin from head to toe regularly.  Tell your health care provider about any new moles or changes in moles, especially if: ? There is a change in a mole's size, shape, or color. ? You have a mole that is larger than a pencil eraser.  Always use sunscreen. Apply sunscreen liberally and repeat throughout the day.  Protect yourself by wearing long sleeves, pants, a wide-brimmed hat, and sunglasses when outside.  What should I know about heart disease, diabetes, and high blood pressure?  If you are 52-4 years of age, have your blood pressure checked every 3-5 years. If you are 63 years of age or older, have your blood pressure checked every year. You should have your blood pressure measured twice-once when you are at a  hospital or clinic, and once when you are not at a hospital or clinic. Record the average of the two measurements. To check your blood pressure when you are not at a hospital or clinic, you can use: ? An automated blood pressure machine at a pharmacy. ? A home blood pressure monitor.  Talk to your health care provider about your target blood pressure.  If you are between 37-76 years old, ask your health care provider if you should take aspirin to prevent heart disease.  Have regular diabetes screenings by checking your fasting blood sugar level. ? If you are at a normal weight and have a low risk for diabetes, have this test once every three years after the age of 39. ? If you are overweight and have a high risk for diabetes, consider being tested at a younger age or more often.  A one-time screening for abdominal aortic aneurysm (AAA) by ultrasound is recommended for men aged 4-75 years who are current or former smokers. What should I know about preventing infection? Hepatitis B If you have a higher risk for hepatitis B, you should be screened for this virus. Talk with your health care provider to find out if you are at risk for hepatitis B infection. Hepatitis C Blood testing is recommended for:  Everyone born from [birth year] through 1965.  Anyone with known risk factors for hepatitis C.  Sexually Transmitted Diseases (STDs)  You should be screened each year for STDs including gonorrhea and chlamydia if: ? You are sexually active and are younger than 55 years of age. ? You are older than 55 years of age and your health care provider tells you that you are at risk for this type of infection. ? Your sexual activity has changed since you were last screened and you are at an increased risk for chlamydia or gonorrhea. Ask your health care provider if you are at risk.  Talk with your health care provider about whether you are at high risk of being infected with HIV. Your health care provider may  recommend a prescription medicine to help prevent HIV infection.  What else can I do?  Schedule regular health, dental, and eye exams.  Stay current with your vaccines (immunizations).  Do not use any tobacco products, such as cigarettes, chewing tobacco, and e-cigarettes. If you need help quitting, ask your health care provider.  Limit alcohol intake to no more than 2 drinks per day. One drink equals 12 ounces of beer, 5 ounces of wine, or 1 ounces of hard liquor.  Do not use street drugs.  Do not share needles.  Ask your health care provider for help if you need support or information about quitting drugs.  Tell your health care provider if you often feel depressed.  Tell your health care provider if you have ever been abused or do not feel safe at home. This information is not intended to replace advice given to you by your health care provider. Make sure you discuss any  questions you have with your health care provider. Document Released: 06/29/2007 Document Revised: 08/30/2015 Document Reviewed: 10/04/2014 Elsevier Interactive Patient Education  [employer].

## 2016-09-23 ENCOUNTER — Ambulatory Visit (INDEPENDENT_AMBULATORY_CARE_PROVIDER_SITE_OTHER): Payer: 59 | Admitting: Internal Medicine

## 2016-09-23 ENCOUNTER — Other Ambulatory Visit (INDEPENDENT_AMBULATORY_CARE_PROVIDER_SITE_OTHER): Payer: 59

## 2016-09-23 ENCOUNTER — Encounter: Payer: Self-pay | Admitting: Internal Medicine

## 2016-09-23 VITALS — BP 102/72 | HR 65 | Temp 98.7°F | Resp 16 | Ht 72.0 in | Wt 164.0 lb

## 2016-09-23 DIAGNOSIS — Z0000 Encounter for general adult medical examination without abnormal findings: Secondary | ICD-10-CM

## 2016-09-23 LAB — COMPREHENSIVE METABOLIC PANEL
ALT: 15 U/L (ref 0–53)
AST: 17 U/L (ref 0–37)
Albumin: 4.2 g/dL (ref 3.5–5.2)
Alkaline Phosphatase: 97 U/L (ref 39–117)
BILIRUBIN TOTAL: 0.6 mg/dL (ref 0.2–1.2)
BUN: 18 mg/dL (ref 6–23)
CALCIUM: 9.2 mg/dL (ref 8.4–10.5)
CO2: 29 meq/L (ref 19–32)
Chloride: 103 mEq/L (ref 96–112)
Creatinine, Ser: 1.09 mg/dL (ref 0.40–1.50)
GFR: 74.62 mL/min (ref >60.00)
GLUCOSE: 72 mg/dL (ref 70–99)
POTASSIUM: 4.2 meq/L (ref 3.5–5.1)
Sodium: 139 mEq/L (ref 135–145)
Total Protein: 6.8 g/dL (ref 6.0–8.3)

## 2016-09-23 LAB — CBC WITH DIFFERENTIAL/PLATELET
BASOS ABS: 0 10*3/uL (ref 0.0–0.1)
BASOS PCT: 0.9 % (ref 0.0–3.0)
EOS PCT: 6 % — AB (ref 0.0–5.0)
Eosinophils Absolute: 0.2 10*3/uL (ref 0.0–0.7)
HEMATOCRIT: 45.7 % (ref 39.0–52.0)
Hemoglobin: 15.3 g/dL (ref 13.0–17.0)
LYMPHS ABS: 1 10*3/uL (ref 0.7–4.0)
LYMPHS PCT: 25.1 % (ref 12.0–46.0)
MCHC: 33.4 g/dL (ref 30.0–36.0)
MCV: 85.2 fl (ref 78.0–100.0)
MONOS PCT: 8.8 % (ref 3.0–12.0)
Monocytes Absolute: 0.4 10*3/uL (ref 0.1–1.0)
NEUTROS ABS: 2.4 10*3/uL (ref 1.4–7.7)
NEUTROS PCT: 59.2 % (ref 43.0–77.0)
PLATELETS: 272 10*3/uL (ref 150.0–400.0)
RBC: 5.37 Mil/uL (ref 4.22–5.81)
RDW: 13.3 % (ref 11.5–15.5)
WBC: 4 10*3/uL (ref 4.0–10.5)

## 2016-09-23 LAB — LIPID PANEL
CHOLESTEROL: 178 mg/dL (ref 0–200)
HDL: 47.4 mg/dL (ref >39.00)
LDL CALC: 107 mg/dL — AB (ref 0–99)
NonHDL: 130.75
TRIGLYCERIDES: 119 mg/dL (ref 0.0–149.0)
Total CHOL/HDL Ratio: 4
VLDL: 23.8 mg/dL (ref 0.0–40.0)

## 2016-09-23 LAB — HEMOGLOBIN A1C: Hgb A1c MFr Bld: 5.6 % (ref 4.6–6.5)

## 2016-09-23 LAB — TSH: TSH: 0.84 u[IU]/mL (ref 0.35–4.50)

## 2016-09-24 ENCOUNTER — Encounter: Payer: Self-pay | Admitting: Internal Medicine

## 2016-09-24 LAB — PSA, TOTAL AND FREE
PSA, % Free: 33 % (calc) (ref >25)
PSA, FREE: 0.3 ng/mL
PSA, TOTAL: 0.9 ng/mL (ref <=4.0)

## 2016-12-17 ENCOUNTER — Telehealth: Payer: Self-pay | Admitting: Internal Medicine

## 2016-12-17 NOTE — Telephone Encounter (Signed)
Copied from [place]. Topic: General - Other >> Dec 17, 2016  3:23 PM Valla Leaver wrote: Reason for CRM: Patient received a rejection from [facility] that said "form collection data not provided" regarding his biometric screening for work. Patient gave the form in November and it was rejected. Please call back to discuss. Patient of [place].

## 2016-12-18 NOTE — Telephone Encounter (Signed)
Spoke with pt, form faxed to [facility] with lab date added to form.

## 2017-01-03 ENCOUNTER — Telehealth: Payer: Self-pay | Admitting: Internal Medicine

## 2017-01-03 NOTE — Telephone Encounter (Signed)
Copied from [place] [id number]. Topic: Quick Communication - See Telephone Encounter >> Jan 03, 2017  9:16 AM Ether Griffins B wrote: CRM for notification. 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 He hasnt received a confirmation email from [facility] where it had been resubmitted. He is wanting to check on that it needs to be submitted and completed by 12/31 for it to count for this year ins purposes. He would like a call on this cell number [phone]. 01/03/17.

## 2017-01-03 NOTE — Telephone Encounter (Signed)
Spoke with pt, physical form has been faxed to insurance again. Pt is going to contact company and let me know if they have received it or not.

## 2017-05-09 NOTE — Progress Notes (Signed)
Subjective:    Patient ID: Timothy Miles, male    DOB: [birth year], 57 y.o.   MRN: [id number]  HPI He is here for a physical exam.   He has no concerns.  He denies any changes in his health.   Medications and allergies reviewed with patient and updated if appropriate.  Patient Active Problem List   Diagnosis Date Noted  . 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 BPH (benign prostatic hyperplasia) 07/19/2013  . Hyperlipidemia 02/04/2012  . NEPHROLITHIASIS, HX OF 04/26/2009  . Allergic rhinitis 03/09/2007    Current Outpatient Medications on File Prior to Visit  Medication Sig Dispense Refill  . loratadine (CLARITIN) 10 MG tablet Take 10 mg by mouth daily as needed.      No current facility-administered medications on file prior to visit.     Past Medical History:  Diagnosis Date  . Allergic rhinitis, seasonal    mainly Spring  . Blood donor   . Elevated alkaline phosphatase level   . Premature ventricular contractions   . Renal calculus 1999    Past Surgical History:  Procedure Laterality Date  . COLONOSCOPY  2013   negative  . renal calculus OP  1999   passed spontaneously  . WISDOM TOOTH EXTRACTION    . WRIST FRACTURE SURGERY  1995   Left wrist    Social History   Socioeconomic History  . Marital status: Married    Spouse name: Not on file  . Number of children: Not on file  . Years of education: Not on file  . Highest education level: Not on file  Occupational History  . Not on file  Social Needs  . Financial resource strain: Not on file  . Food insecurity:    Worry: Not on file    Inability: Not on file  . Transportation needs:    Medical: Not on file    Non-medical: Not on file  Tobacco Use  . Smoking status: Never Smoker  . Smokeless tobacco: Never Used  Substance and Sexual Activity  . Alcohol use: Yes    Alcohol/week: 0.6 oz    Types: 1 Glasses of wine per week    Comment: Social  . Drug use: No  . Sexual activity: Not on file  Lifestyle  .  Physical activity:    Days per week: Not on file    Minutes per session: Not on file  . Stress: Not on file  Relationships  . Social connections:    Talks on phone: Not on file    Gets together: Not on file    Attends religious service: Not on file    Active member of club or organization: Not on file    Attends meetings of clubs or organizations: Not on file    Relationship status: Not on file  Other Topics Concern  . Not on file  Social History Narrative   No diet; no caffeine   Married   Regular exercise- yes: 3 hrs of CVE and weights    Family History  Problem Relation Age of Onset  . Melanoma Father        intranasally  . Skin cancer Mother        Basal cell  . Diabetes Brother        diet controlled  . Lung cancer Maternal Grandfather        smoker  . Angelman syndrome Daughter        15th Chromosome partial deletion  . Stroke  Neg Hx   . Heart disease Neg Hx   . Colon cancer Neg Hx     Review of Systems  Constitutional: Negative for appetite change, chills, fatigue, fever and unexpected weight change.  Eyes: Negative for visual disturbance.  Respiratory: Negative for cough, shortness of breath and wheezing.   Cardiovascular: Negative for chest pain, palpitations and leg swelling.  Gastrointestinal: Negative for abdominal pain, blood in stool, constipation, diarrhea and nausea.       No gerd  Genitourinary: Negative for dysuria and hematuria.  Musculoskeletal: Negative for arthralgias, back pain and myalgias.  Skin: Negative for color change and rash.  Neurological: Negative for light-headedness and headaches.  Psychiatric/Behavioral: Negative for dysphoric mood and sleep disturbance. The patient is not nervous/anxious.        Objective:   Vitals:   05/12/17 0854  BP: 114/64  Pulse: 60  Resp: 16  Temp: 98.4 F (36.9 C)  SpO2: 98%   Filed Weights   05/12/17 0854  Weight: 167 lb (75.8 kg)   Body mass index is 22.65 kg/m.  Wt Readings from Last  3 Encounters:  05/12/17 167 lb (75.8 kg)  09/23/16 164 lb (74.4 kg)  09/22/15 159 lb (72.1 kg)     Physical Exam Constitutional: He appears well-developed and well-nourished. No distress.  HENT:  Head: Normocephalic and atraumatic.  Right Ear: External ear normal.  Left Ear: External ear normal.  Mouth/Throat: Oropharynx is clear and moist.  Normal ear canals and TM b/l  Eyes: Conjunctivae and EOM are normal.  Neck: Neck supple. No tracheal deviation present. No thyromegaly present.  No carotid bruit  Cardiovascular: Normal rate, regular rhythm, normal heart sounds and intact distal pulses.   No murmur heard. Pulmonary/Chest: Effort normal and breath sounds normal. No respiratory distress. He has no wheezes. He has no rales.  Abdominal: Soft. He exhibits no distension. There is no tenderness.  Genitourinary: deferred  Musculoskeletal: He exhibits no edema.  Lymphadenopathy:   He has no cervical adenopathy.  Skin: Skin is warm and dry. He is not diaphoretic.  Psychiatric: He has a normal mood and affect. His behavior is normal.         Assessment & Plan:   Physical exam: Screening blood work  ordered Immunizations     Up to date,  Discussed shingrix Colonoscopy     Up to date  Eye exams     Up to date  EKG       Done 09/2016 Exercise   Running regularly Weight   BMI normal Skin   - sees derm annually Substance abuse      none  See Problem List for Assessment and Plan of chronic medical problems.  FU in one year

## 2017-05-09 NOTE — Patient Instructions (Addendum)
Test(s) ordered today. Your results will be released to MyChart (or called to you) after review, usually within 72hours after test completion. If any changes need to be made, you will be notified at that same time.  All other Health Maintenance issues reviewed.   All recommended immunizations and age-appropriate screenings are up-to-date or discussed.  No immunizations administered today.   Medications reviewed and updated.  No changes recommended at this time.   Please followup in one year    Health Maintenance, Male A healthy lifestyle and preventive care is important for your health and wellness. Ask your health care provider about what schedule of regular examinations is right for you. What should I know about weight and diet? Eat a Healthy Diet  Eat plenty of vegetables, fruits, whole grains, low-fat dairy products, and lean protein.  Do not eat a lot of foods high in solid fats, added sugars, or salt.  Maintain a Healthy Weight Regular exercise can help you achieve or maintain a healthy weight. You should:  Do at least 150 minutes of exercise each week. The exercise should increase your heart rate and make you sweat (moderate-intensity exercise).  Do strength-training exercises at least twice a week.  Watch Your Levels of Cholesterol and Blood Lipids  Have your blood tested for lipids and cholesterol every 5 years starting at 56 years of age. If you are at high risk for heart disease, you should start having your blood tested when you are 56 years old. You may need to have your cholesterol levels checked more often if: ? Your lipid or cholesterol levels are high. ? You are older than 56 years of age. ? You are at high risk for heart disease.  What should I know about cancer screening? Many types of cancers can be detected early and may often be prevented. Lung Cancer  You should be screened every year for lung cancer if: ? You are a current smoker who has smoked for  at least 30 years. ? You are a former smoker who has quit within the past 15 years.  Talk to your health care provider about your screening options, when you should start screening, and how often you should be screened.  Colorectal Cancer  Routine colorectal cancer screening usually begins at 56 years of age and should be repeated every 5-10 years until you are 56 years old. You may need to be screened more often if early forms of precancerous polyps or small growths are found. Your health care provider may recommend screening at an earlier age if you have risk factors for colon cancer.  Your health care provider may recommend using home test kits to check for hidden blood in the stool.  A small camera at the end of a tube can be used to examine your colon (sigmoidoscopy or colonoscopy). This checks for the earliest forms of colorectal cancer.  Prostate and Testicular Cancer  Depending on your age and overall health, your health care provider may do certain tests to screen for prostate and testicular cancer.  Talk to your health care provider about any symptoms or concerns you have about testicular or prostate cancer.  Skin Cancer  Check your skin from head to toe regularly.  Tell your health care provider about any new moles or changes in moles, especially if: ? There is a change in a mole's size, shape, or color. ? You have a mole that is larger than a pencil eraser.  Always use sunscreen. Apply sunscreen liberally   and repeat throughout the day.  Protect yourself by wearing long sleeves, pants, a wide-brimmed hat, and sunglasses when outside.  What should I know about heart disease, diabetes, and high blood pressure?  If you are 18-39 years of age, have your blood pressure checked every 3-5 years. If you are 40 years of age or older, have your blood pressure checked every year. You should have your blood pressure measured twice-once when you are at a hospital or clinic, and once  when you are not at a hospital or clinic. Record the average of the two measurements. To check your blood pressure when you are not at a hospital or clinic, you can use: ? An automated blood pressure machine at a pharmacy. ? A home blood pressure monitor.  Talk to your health care provider about your target blood pressure.  If you are between 45-79 years old, ask your health care provider if you should take aspirin to prevent heart disease.  Have regular diabetes screenings by checking your fasting blood sugar level. ? If you are at a normal weight and have a low risk for diabetes, have this test once every three years after the age of 45. ? If you are overweight and have a high risk for diabetes, consider being tested at a younger age or more often.  A one-time screening for abdominal aortic aneurysm (AAA) by ultrasound is recommended for men aged 65-75 years who are current or former smokers. What should I know about preventing infection? Hepatitis B If you have a higher risk for hepatitis B, you should be screened for this virus. Talk with your health care provider to find out if you are at risk for hepatitis B infection. Hepatitis C Blood testing is recommended for:  Everyone born from 1944 through 1965.  Anyone with known risk factors for hepatitis C.  Sexually Transmitted Diseases (STDs)  You should be screened each year for STDs including gonorrhea and chlamydia if: ? You are sexually active and are younger than 56 years of age. ? You are older than 56 years of age and your health care provider tells you that you are at risk for this type of infection. ? Your sexual activity has changed since you were last screened and you are at an increased risk for chlamydia or gonorrhea. Ask your health care provider if you are at risk.  Talk with your health care provider about whether you are at high risk of being infected with HIV. Your health care provider may recommend a prescription  medicine to help prevent HIV infection.  What else can I do?  Schedule regular health, dental, and eye exams.  Stay current with your vaccines (immunizations).  Do not use any tobacco products, such as cigarettes, chewing tobacco, and e-cigarettes. If you need help quitting, ask your health care provider.  Limit alcohol intake to no more than 2 drinks per day. One drink equals 12 ounces of beer, 5 ounces of wine, or 1 ounces of hard liquor.  Do not use street drugs.  Do not share needles.  Ask your health care provider for help if you need support or information about quitting drugs.  Tell your health care provider if you often feel depressed.  Tell your health care provider if you have ever been abused or do not feel safe at home. This information is not intended to replace advice given to you by your health care provider. Make sure you discuss any questions you have with your health   care provider. Document Released: 06/29/2007 Document Revised: 08/30/2015 Document Reviewed: 10/04/2014 Elsevier Interactive Patient Education  2018 [employer]  

## 2017-05-12 ENCOUNTER — Ambulatory Visit (INDEPENDENT_AMBULATORY_CARE_PROVIDER_SITE_OTHER): Payer: 59 | Admitting: Internal Medicine

## 2017-05-12 ENCOUNTER — Other Ambulatory Visit (INDEPENDENT_AMBULATORY_CARE_PROVIDER_SITE_OTHER): Payer: 59

## 2017-05-12 ENCOUNTER — Encounter: Payer: Self-pay | Admitting: Internal Medicine

## 2017-05-12 VITALS — BP 114/64 | HR 60 | Temp 98.4°F | Resp 16 | Ht 72.0 in | Wt 167.0 lb

## 2017-05-12 DIAGNOSIS — Z0000 Encounter for general adult medical examination without abnormal findings: Secondary | ICD-10-CM | POA: Diagnosis not present

## 2017-05-12 DIAGNOSIS — J301 Allergic rhinitis due to pollen: Secondary | ICD-10-CM | POA: Diagnosis not present

## 2017-05-12 DIAGNOSIS — N400 Enlarged prostate without lower urinary tract symptoms: Secondary | ICD-10-CM

## 2017-05-12 DIAGNOSIS — E782 Mixed hyperlipidemia: Secondary | ICD-10-CM | POA: Diagnosis not present

## 2017-05-12 LAB — CBC WITH DIFFERENTIAL/PLATELET
BASOS ABS: 0 10*3/uL (ref 0.0–0.1)
BASOS PCT: 0.9 % (ref 0.0–3.0)
EOS ABS: 0.1 10*3/uL (ref 0.0–0.7)
Eosinophils Relative: 3.2 % (ref 0.0–5.0)
HEMATOCRIT: 44.3 % (ref 39.0–52.0)
HEMOGLOBIN: 15.2 g/dL (ref 13.0–17.0)
LYMPHS PCT: 26.3 % (ref 12.0–46.0)
Lymphs Abs: 1.1 10*3/uL (ref 0.7–4.0)
MCHC: 34.3 g/dL (ref 30.0–36.0)
MCV: 82.7 fl (ref 78.0–100.0)
MONO ABS: 0.3 10*3/uL (ref 0.1–1.0)
Monocytes Relative: 8.1 % (ref 3.0–12.0)
Neutro Abs: 2.7 10*3/uL (ref 1.4–7.7)
Neutrophils Relative %: 61.5 % (ref 43.0–77.0)
Platelets: 251 10*3/uL (ref 150.0–400.0)
RBC: 5.36 Mil/uL (ref 4.22–5.81)
RDW: 13.7 % (ref 11.5–15.5)
WBC: 4.3 10*3/uL (ref 4.0–10.5)

## 2017-05-12 LAB — COMPREHENSIVE METABOLIC PANEL
ALBUMIN: 4 g/dL (ref 3.5–5.2)
ALK PHOS: 100 U/L (ref 39–117)
ALT: 13 U/L (ref 0–53)
AST: 14 U/L (ref 0–37)
BILIRUBIN TOTAL: 0.5 mg/dL (ref 0.2–1.2)
BUN: 15 mg/dL (ref 6–23)
CALCIUM: 9.1 mg/dL (ref 8.4–10.5)
CHLORIDE: 105 meq/L (ref 96–112)
CO2: 30 mEq/L (ref 19–32)
CREATININE: 0.98 mg/dL (ref 0.40–1.50)
GFR: 84.17 mL/min (ref >60.00)
Glucose, Bld: 95 mg/dL (ref 70–99)
Potassium: 4.3 mEq/L (ref 3.5–5.1)
Sodium: 140 mEq/L (ref 135–145)
TOTAL PROTEIN: 6.9 g/dL (ref 6.0–8.3)

## 2017-05-12 LAB — LIPID PANEL
CHOLESTEROL: 171 mg/dL (ref 0–200)
HDL: 48.1 mg/dL (ref >39.00)
LDL CALC: 96 mg/dL (ref 0–99)
NonHDL: 123.02
TRIGLYCERIDES: 135 mg/dL (ref 0.0–149.0)
Total CHOL/HDL Ratio: 4
VLDL: 27 mg/dL (ref 0.0–40.0)

## 2017-05-12 LAB — TSH: TSH: 0.66 u[IU]/mL (ref 0.35–4.50)

## 2017-05-12 NOTE — Assessment & Plan Note (Signed)
Asymptomatic Check PSA He deferred prostate exam

## 2017-05-12 NOTE — Assessment & Plan Note (Signed)
Controlled Not currently taking medication

## 2017-05-12 NOTE — Assessment & Plan Note (Signed)
Lifestyle controlled Continue regular exercise and healthy diet Lipid panel, CMP, TSH

## 2017-05-13 LAB — PSA, TOTAL AND FREE
PSA, % FREE: 25 % — AB (ref >25)
PSA, FREE: 0.3 ng/mL
PSA, Total: 1.2 ng/mL (ref <=4.0)

## 2017-05-14 ENCOUNTER — Encounter: Payer: Self-pay | Admitting: Internal Medicine

## 2017-08-27 DIAGNOSIS — D229 Melanocytic nevi, unspecified: Secondary | ICD-10-CM | POA: Diagnosis not present

## 2017-08-27 DIAGNOSIS — L814 Other melanin hyperpigmentation: Secondary | ICD-10-CM | POA: Diagnosis not present

## 2017-08-27 DIAGNOSIS — L821 Other seborrheic keratosis: Secondary | ICD-10-CM | POA: Diagnosis not present

## 2017-12-17 DIAGNOSIS — Z23 Encounter for immunization: Secondary | ICD-10-CM | POA: Diagnosis not present

## 2019-06-01 ENCOUNTER — Encounter: Payer: 59 | Admitting: Internal Medicine

## 2019-06-15 NOTE — Patient Instructions (Addendum)
Blood work was ordered.    All other Health Maintenance issues reviewed.   All recommended immunizations and age-appropriate screenings are up-to-date or discussed.  Tetanus immunization administered today.   Medications reviewed and updated.  Changes include :   none     Please followup in 1 year    Health Maintenance, Male Adopting a healthy lifestyle and getting preventive care are important in promoting health and wellness. Ask your health care provider about:  The right schedule for you to have regular tests and exams.  Things you can do on your own to prevent diseases and keep yourself healthy. What should I know about diet, weight, and exercise? Eat a healthy diet   Eat a diet that includes plenty of vegetables, fruits, low-fat dairy products, and lean protein.  Do not eat a lot of foods that are high in solid fats, added sugars, or sodium. Maintain a healthy weight Body mass index (BMI) is a measurement that can be used to identify possible weight problems. It estimates body fat based on height and weight. Your health care provider can help determine your BMI and help you achieve or maintain a healthy weight. Get regular exercise Get regular exercise. This is one of the most important things you can do for your health. Most adults should:  Exercise for at least 150 minutes each week. The exercise should increase your heart rate and make you sweat (moderate-intensity exercise).  Do strengthening exercises at least twice a week. This is in addition to the moderate-intensity exercise.  Spend less time sitting. Even light physical activity can be beneficial. Watch cholesterol and blood lipids Have your blood tested for lipids and cholesterol at 58 years of age, then have this test every 5 years. You may need to have your cholesterol levels checked more often if:  Your lipid or cholesterol levels are high.  You are older than 58 years of age.  You are at high risk  for heart disease. What should I know about cancer screening? Many types of cancers can be detected early and may often be prevented. Depending on your health history and family history, you may need to have cancer screening at various ages. This may include screening for:  Colorectal cancer.  Prostate cancer.  Skin cancer.  Lung cancer. What should I know about heart disease, diabetes, and high blood pressure? Blood pressure and heart disease  High blood pressure causes heart disease and increases the risk of stroke. This is more likely to develop in people who have high blood pressure readings, are of African descent, or are overweight.  Talk with your health care provider about your target blood pressure readings.  Have your blood pressure checked: ? Every 3-5 years if you are 110-56 years of age. ? Every year if you are 3 years old or older.  If you are between the ages of 84 and 29 and are a current or former smoker, ask your health care provider if you should have a one-time screening for abdominal aortic aneurysm (AAA). Diabetes Have regular diabetes screenings. This checks your fasting blood sugar level. Have the screening done:  Once every three years after age 44 if you are at a normal weight and have a low risk for diabetes.  More often and at a younger age if you are overweight or have a high risk for diabetes. What should I know about preventing infection? Hepatitis B If you have a higher risk for hepatitis B, you should be screened  for this virus. Talk with your health care provider to find out if you are at risk for hepatitis B infection. Hepatitis C Blood testing is recommended for:  Everyone born from [birth year] through 1964.  Anyone with known risk factors for hepatitis C. Sexually transmitted infections (STIs)  You should be screened each year for STIs, including gonorrhea and chlamydia, if: ? You are sexually active and are younger than 58 years of age. ? You  are older than 58 years of age and your health care provider tells you that you are at risk for this type of infection. ? Your sexual activity has changed since you were last screened, and you are at increased risk for chlamydia or gonorrhea. Ask your health care provider if you are at risk.  Ask your health care provider about whether you are at high risk for HIV. Your health care provider may recommend a prescription medicine to help prevent HIV infection. If you choose to take medicine to prevent HIV, you should first get tested for HIV. You should then be tested every 3 months for as long as you are taking the medicine. Follow these instructions at home: Lifestyle  Do not use any products that contain nicotine or tobacco, such as cigarettes, e-cigarettes, and chewing tobacco. If you need help quitting, ask your health care provider.  Do not use street drugs.  Do not share needles.  Ask your health care provider for help if you need support or information about quitting drugs. Alcohol use  Do not drink alcohol if your health care provider tells you not to drink.  If you drink alcohol: ? Limit how much you have to 0-2 drinks a day. ? Be aware of how much alcohol is in your drink. In the [place] one drink equals one 12 oz bottle of beer (355 mL), one 5 oz glass of wine (148 mL), or one 1 oz glass of hard liquor (44 mL). General instructions  Schedule regular health, dental, and eye exams.  Stay current with your vaccines.  Tell your health care provider if: ? You often feel depressed. ? You have ever been abused or do not feel safe at home. Summary  Adopting a healthy lifestyle and getting preventive care are important in promoting health and wellness.  Follow your health care provider's instructions about healthy diet, exercising, and getting tested or screened for diseases.  Follow your health care provider's instructions on monitoring your cholesterol and blood pressure. This  information is not intended to replace advice given to you by your health care provider. Make sure you discuss any questions you have with your health care provider. Document Revised: 12/24/2017 Document Reviewed: 12/24/2017 Elsevier Patient Education  2020 [employer].

## 2019-06-15 NOTE — Progress Notes (Signed)
Subjective:    Patient ID: Timothy Miles, male    DOB: [birth year], 59 y.o.   MRN: [id number]  HPI He is here for a physical exam.   He denies changes in his health.  He was not exercising regularly over the past year, but has started to run again.   He has no concerns.   Medications and allergies reviewed with patient and updated if appropriate.  Patient Active Problem List   Diagnosis Date Noted   Blood donor 05/20/2014   BPH (benign prostatic hyperplasia) 07/19/2013   NEPHROLITHIASIS, HX OF 04/26/2009   Allergic rhinitis 03/09/2007    No current outpatient medications on file prior to visit.   No current facility-administered medications on file prior to visit.    Past Medical History:  Diagnosis Date   Allergic rhinitis, seasonal    mainly Spring   Blood donor    Elevated alkaline phosphatase level    Premature ventricular contractions    Renal calculus 1999    Past Surgical History:  Procedure Laterality Date   COLONOSCOPY  2013   negative   renal calculus OP  1999   passed spontaneously   WISDOM TOOTH EXTRACTION     WRIST FRACTURE SURGERY  1995   Left wrist    Social History   Socioeconomic History   Marital status: Married    Spouse name: Not on file   Number of children: Not on file   Years of education: Not on file   Highest education level: Not on file  Occupational History   Not on file  Tobacco Use   Smoking status: Never Smoker   Smokeless tobacco: Never Used  Substance and Sexual Activity   Alcohol use: Yes    Alcohol/week: 1.0 standard drinks    Types: 1 Glasses of wine per week    Comment: Social   Drug use: No   Sexual activity: Not on file  Other Topics Concern   Not on file  Social History Narrative   No diet; no caffeine   Married   Regular exercise- yes: 3 hrs of CVE and weights   Social Determinants of Health   Financial Resource Strain:    Difficulty of Paying Living Expenses:   Food  Insecurity:    Worried About Charity fundraiser in the Last Year:    Arboriculturist in the Last Year:   Transportation Needs:    Film/video editor (Medical):    Lack of Transportation (Non-Medical):   Physical Activity:    Days of Exercise per Week:    Minutes of Exercise per Session:   Stress:    Feeling of Stress :   Social Connections:    Frequency of Communication with Friends and Family:    Frequency of Social Gatherings with Friends and Family:    Attends Religious Services:    Active Member of Clubs or Organizations:    Attends Music therapist:    Marital Status:     Family History  Problem Relation Age of Onset   Melanoma Father        intranasally   Skin cancer Mother        Basal cell   Diabetes Brother        diet controlled   Lung cancer Maternal Grandfather        smoker   Angelman syndrome Daughter        15th Chromosome partial deletion   Stroke Neg Hx  Heart disease Neg Hx    Colon cancer Neg Hx     Review of Systems  Constitutional: Negative for chills and fever.  Eyes: Negative for visual disturbance.  Respiratory: Negative for cough, shortness of breath and wheezing.   Cardiovascular: Negative for chest pain, palpitations and leg swelling.  Gastrointestinal: Negative for abdominal pain, blood in stool, constipation, diarrhea and nausea.       No gerd  Genitourinary: Negative for difficulty urinating, dysuria and hematuria.  Musculoskeletal: Negative for arthralgias and back pain.  Skin: Negative for rash.  Neurological: Negative for dizziness, light-headedness and headaches.  Psychiatric/Behavioral: Negative for dysphoric mood. The patient is not nervous/anxious.        Objective:   Vitals:   06/16/19 1007  BP: 118/70  Pulse: 62  Resp: 16  Temp: 98.3 F (36.8 C)  SpO2: 96%   Filed Weights   06/16/19 1007  Weight: 173 lb (78.5 kg)   Body mass index is 23.46 kg/m.  BP Readings from Last  3 Encounters:  06/16/19 118/70  05/12/17 114/64  09/23/16 102/72    Wt Readings from Last 3 Encounters:  06/16/19 173 lb (78.5 kg)  05/12/17 167 lb (75.8 kg)  09/23/16 164 lb (74.4 kg)     Physical Exam Constitutional: He appears well-developed and well-nourished. No distress.  HENT:  Head: Normocephalic and atraumatic.  Right Ear: External ear normal.  Left Ear: External ear normal.  Mouth/Throat: Oropharynx is clear and moist.  Normal ear canals and TM b/l  Eyes: Conjunctivae and EOM are normal.  Neck: Neck supple. No tracheal deviation present. No thyromegaly present.  No carotid bruit  Cardiovascular: Normal rate, regular rhythm, normal heart sounds and intact distal pulses. No murmur heard. Pulmonary/Chest: Effort normal and breath sounds normal. No respiratory distress. He has no wheezes. He has no rales.  Abdominal: Soft. He exhibits no distension. There is no tenderness.  Genitourinary: deferred  Musculoskeletal: He exhibits no edema.  Lymphadenopathy:   He has no cervical adenopathy.  Skin: Skin is warm and dry. He is not diaphoretic.  Psychiatric: He has a normal mood and affect. His behavior is normal.         Assessment & Plan:   Physical exam: Screening blood work  ordered Immunizations  tdap today, others up to date Colonoscopy   Up to date  Eye exams   Due - will schedule Exercise   running Weight  Normal BMI Substance abuse   none Sees derm Q 6 months  See Problem List for Assessment and Plan of chronic medical problems.    This visit occurred during the SARS-CoV-2 public health emergency.  Safety protocols were in place, including screening questions prior to the visit, additional usage of staff PPE, and extensive cleaning of exam room while observing appropriate contact time as indicated for disinfecting solutions.

## 2019-06-16 ENCOUNTER — Ambulatory Visit (INDEPENDENT_AMBULATORY_CARE_PROVIDER_SITE_OTHER): Payer: 59 | Admitting: Internal Medicine

## 2019-06-16 ENCOUNTER — Encounter: Payer: Self-pay | Admitting: Internal Medicine

## 2019-06-16 ENCOUNTER — Other Ambulatory Visit: Payer: Self-pay

## 2019-06-16 VITALS — BP 118/70 | HR 62 | Temp 98.3°F | Resp 16 | Ht 72.0 in | Wt 173.0 lb

## 2019-06-16 DIAGNOSIS — Z0000 Encounter for general adult medical examination without abnormal findings: Secondary | ICD-10-CM | POA: Diagnosis not present

## 2019-06-16 DIAGNOSIS — Z125 Encounter for screening for malignant neoplasm of prostate: Secondary | ICD-10-CM

## 2019-06-16 DIAGNOSIS — N400 Enlarged prostate without lower urinary tract symptoms: Secondary | ICD-10-CM

## 2019-06-16 DIAGNOSIS — Z23 Encounter for immunization: Secondary | ICD-10-CM | POA: Diagnosis not present

## 2019-06-16 LAB — CBC WITH DIFFERENTIAL/PLATELET
Basophils Absolute: 0 10*3/uL (ref 0.0–0.1)
Basophils Relative: 0.8 % (ref 0.0–3.0)
Eosinophils Absolute: 0.2 10*3/uL (ref 0.0–0.7)
Eosinophils Relative: 3.6 % (ref 0.0–5.0)
HCT: 45.3 % (ref 39.0–52.0)
Hemoglobin: 15.4 g/dL (ref 13.0–17.0)
Lymphocytes Relative: 32.2 % (ref 12.0–46.0)
Lymphs Abs: 1.5 10*3/uL (ref 0.7–4.0)
MCHC: 34 g/dL (ref 30.0–36.0)
MCV: 85.7 fl (ref 78.0–100.0)
Monocytes Absolute: 0.5 10*3/uL (ref 0.1–1.0)
Monocytes Relative: 10.1 % (ref 3.0–12.0)
Neutro Abs: 2.5 10*3/uL (ref 1.4–7.7)
Neutrophils Relative %: 53.3 % (ref 43.0–77.0)
Platelets: 272 10*3/uL (ref 150.0–400.0)
RBC: 5.28 Mil/uL (ref 4.22–5.81)
RDW: 13.3 % (ref 11.5–15.5)
WBC: 4.6 10*3/uL (ref 4.0–10.5)

## 2019-06-16 LAB — COMPREHENSIVE METABOLIC PANEL
ALT: 30 U/L (ref 0–53)
AST: 29 U/L (ref 0–37)
Albumin: 4.4 g/dL (ref 3.5–5.2)
Alkaline Phosphatase: 104 U/L (ref 39–117)
BUN: 19 mg/dL (ref 6–23)
CO2: 30 mEq/L (ref 19–32)
Calcium: 9.6 mg/dL (ref 8.4–10.5)
Chloride: 104 mEq/L (ref 96–112)
Creatinine, Ser: 1.04 mg/dL (ref 0.40–1.50)
GFR: 73.39 mL/min (ref >60.00)
Glucose, Bld: 89 mg/dL (ref 70–99)
Potassium: 4.4 mEq/L (ref 3.5–5.1)
Sodium: 138 mEq/L (ref 135–145)
Total Bilirubin: 0.7 mg/dL (ref 0.2–1.2)
Total Protein: 7.2 g/dL (ref 6.0–8.3)

## 2019-06-16 LAB — LIPID PANEL
Cholesterol: 190 mg/dL (ref 0–200)
HDL: 44.7 mg/dL (ref >39.00)
LDL Cholesterol: 118 mg/dL — ABNORMAL HIGH (ref 0–99)
NonHDL: 145.51
Total CHOL/HDL Ratio: 4
Triglycerides: 137 mg/dL (ref 0.0–149.0)
VLDL: 27.4 mg/dL (ref 0.0–40.0)

## 2019-06-16 LAB — TSH: TSH: 0.75 u[IU]/mL (ref 0.35–4.50)

## 2019-06-16 NOTE — Assessment & Plan Note (Signed)
Chronic No LUTS psa today

## 2019-06-17 ENCOUNTER — Encounter: Payer: Self-pay | Admitting: Internal Medicine

## 2019-06-17 LAB — PSA, TOTAL AND FREE
PSA, % Free: 18 % (calc) — ABNORMAL LOW (ref >25)
PSA, Free: 0.2 ng/mL
PSA, Total: 1.1 ng/mL (ref <=4.0)

## 2019-11-15 NOTE — Progress Notes (Signed)
Subjective:    Patient ID: Timothy Miles, male    DOB: 05-26-1960, 60 y.o.   MRN: [id number]  HPI The patient is here for an acute visit.   Blood in stool -he noticed blood in his stool 3 days ago for the first time.  The blood was in the stool itself.  Yesterday he had blood in the water.  Prior to his first episode he did have some mild constipation.  His bowel movement consisted of hard stool and then soft-watery stool.  He typically has very normal bowel movements.  He denies any abdominal pain, rectal pain, nausea, change in appetite, fevers or unintentional weight loss.  He denies any changes in diet, medications or supplements.  He is not aware that he had internal hemorrhoids on his last colonoscopy.   Last colonoscopy - Dr Deatra Ina, 05/22/12 - internal hemorrhoids, normal colonoscopy.    Medications and allergies reviewed with patient and updated if appropriate.  Patient Active Problem List   Diagnosis Date Noted  . 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 BPH (benign prostatic hyperplasia) 07/19/2013  . NEPHROLITHIASIS, HX OF 04/26/2009  . Allergic rhinitis 03/09/2007    No current outpatient medications on file prior to visit.   No current facility-administered medications on file prior to visit.    Past Medical History:  Diagnosis Date  . Allergic rhinitis, seasonal    mainly Spring  . Blood donor   . Elevated alkaline phosphatase level   . Premature ventricular contractions   . Renal calculus 1999    Past Surgical History:  Procedure Laterality Date  . COLONOSCOPY  2013   negative  . renal calculus OP  1999   passed spontaneously  . WISDOM TOOTH EXTRACTION    . WRIST FRACTURE SURGERY  1995   Left wrist    Social History   Socioeconomic History  . Marital status: Married    Spouse name: Not on file  . Number of children: Not on file  . Years of education: Not on file  . Highest education level: Not on file  Occupational History  . Not on file  Tobacco Use    . Smoking status: Never Smoker  . Smokeless tobacco: Never Used  Substance and Sexual Activity  . Alcohol use: Yes    Alcohol/week: 1.0 standard drink    Types: 1 Glasses of wine per week    Comment: Social  . Drug use: No  . Sexual activity: Not on file  Other Topics Concern  . Not on file  Social History Narrative   No diet; no caffeine   Married   Regular exercise- yes: 3 hrs of CVE and weights   Social Determinants of Health   Financial Resource Strain:   . Difficulty of Paying Living Expenses: Not on file  Food Insecurity:   . Worried About Charity fundraiser in the Last Year: Not on file  . Ran Out of Food in the Last Year: Not on file  Transportation Needs:   . Lack of Transportation (Medical): Not on file  . Lack of Transportation (Non-Medical): Not on file  Physical Activity:   . Days of Exercise per Week: Not on file  . Minutes of Exercise per Session: Not on file  Stress:   . Feeling of Stress : Not on file  Social Connections:   . Frequency of Communication with Friends and Family: Not on file  . Frequency of Social Gatherings with Friends and Family: Not on file  .  Attends Religious Services: Not on file  . Active Member of Clubs or Organizations: Not on file  . Attends Archivist Meetings: Not on file  . Marital Status: Not on file    Family History  Problem Relation Age of Onset  . Melanoma Father        intranasally  . Skin cancer Mother        Basal cell  . Diabetes Brother        diet controlled  . Lung cancer Maternal Grandfather        smoker  . Angelman syndrome Daughter        15th Chromosome partial deletion  . Stroke Neg Hx   . Heart disease Neg Hx   . Colon cancer Neg Hx     Review of Systems  Constitutional: Negative for appetite change, fever and unexpected weight change.  Gastrointestinal: Positive for blood in stool and constipation (occasional). Negative for abdominal pain, nausea and rectal pain.  Neurological:  Negative for light-headedness and headaches.       Objective:   Vitals:   11/16/19 0751  BP: 120/72  Pulse: 75  Temp: 98.3 F (36.8 C)  SpO2: 98%   BP Readings from Last 3 Encounters:  11/16/19 120/72  06/16/19 118/70  05/12/17 114/64   Wt Readings from Last 3 Encounters:  11/16/19 172 lb (78 kg)  06/16/19 173 lb (78.5 kg)  05/12/17 167 lb (75.8 kg)   Body mass index is 23.33 kg/m.   Physical Exam Constitutional:      General: He is not in acute distress.    Appearance: Normal appearance. He is not ill-appearing.  HENT:     Head: Normocephalic and atraumatic.  Abdominal:     General: Abdomen is flat. There is no distension.     Palpations: Abdomen is soft. There is no mass.     Tenderness: There is no abdominal tenderness. There is no guarding or rebound.  Genitourinary:    Comments: Rectal exam deferred to GI Skin:    General: Skin is warm and dry.  Neurological:     Mental Status: He is alert.            Assessment & Plan:    See Problem List for Assessment and Plan of chronic medical problems.    This visit occurred during the SARS-CoV-2 public health emergency.  Safety protocols were in place, including screening questions prior to the visit, additional usage of staff PPE, and extensive cleaning of exam room while observing appropriate contact time as indicated for disinfecting solutions.

## 2019-11-16 ENCOUNTER — Ambulatory Visit (INDEPENDENT_AMBULATORY_CARE_PROVIDER_SITE_OTHER): Payer: 59 | Admitting: Internal Medicine

## 2019-11-16 ENCOUNTER — Other Ambulatory Visit: Payer: Self-pay

## 2019-11-16 ENCOUNTER — Encounter: Payer: Self-pay | Admitting: Internal Medicine

## 2019-11-16 VITALS — BP 120/72 | HR 75 | Temp 98.3°F | Ht 72.0 in | Wt 172.0 lb

## 2019-11-16 DIAGNOSIS — K625 Hemorrhage of anus and rectum: Secondary | ICD-10-CM | POA: Diagnosis not present

## 2019-11-16 NOTE — Patient Instructions (Addendum)
Let me know which GI doctor you would like to see.

## 2019-11-16 NOTE — Assessment & Plan Note (Signed)
Acute 2 episodes of bright red blood per rectum without any other concerning symptoms Most likely this is related to his internal hemorrhoids this occurred after an episode of mild constipation Last colonoscopy was 7 years ago Will refer to GI to consider further evaluation

## 2019-12-07 ENCOUNTER — Encounter: Payer: Self-pay | Admitting: Internal Medicine

## 2020-02-01 ENCOUNTER — Ambulatory Visit: Payer: 59 | Admitting: Internal Medicine

## 2020-02-11 ENCOUNTER — Ambulatory Visit (INDEPENDENT_AMBULATORY_CARE_PROVIDER_SITE_OTHER): Payer: 59 | Admitting: Internal Medicine

## 2020-02-11 ENCOUNTER — Encounter: Payer: Self-pay | Admitting: Internal Medicine

## 2020-02-11 VITALS — BP 128/68 | HR 78 | Ht 72.0 in | Wt 175.0 lb

## 2020-02-11 DIAGNOSIS — K641 Second degree hemorrhoids: Secondary | ICD-10-CM

## 2020-02-11 HISTORY — DX: Second degree hemorrhoids: K64.1

## 2020-02-11 NOTE — Patient Instructions (Signed)
Today we are giving you a handout to read on hemorrhoids.  Follow up with Dr Carlean Purl as needed.   We will put your colonoscopy recall in the system for 2024.   I appreciate the opportunity to care for you. Silvano Rusk, MD, [facility]

## 2020-02-11 NOTE — Progress Notes (Signed)
   Timothy Miles 60 y.o. Timothy Miles 01, 1962 [id number]  Assessment & Plan:   Encounter Diagnosis  Name Primary?  . Prolapsed internal hemorrhoids, grade 2 - with transient bleeding Yes    The history and anoscopic exam support hemorrhoidal bleeding.  He was educated about these today and will follow up as needed and we will plan on a routine colonoscopy 10 years after the last, that will be in 2024.  If he has additional problems he knows to contact me.   I appreciate the opportunity to care for this patient. CC: [name], MD  Subjective:   Chief Complaint: Rectal bleeding  HPI Patient is a 60 year old white man with a history of internal hemorrhoids on colonoscopy in 2014 who had a transient episode of slight bright red blood streaked on the stool and then bloody water the next day a month or so ago.  No recurrent problems.  Moves his bowels without difficulty no significant straining no symptoms of prolapsed hemorrhoids reported.  GI review of systems is otherwise entirely negative. No Known Allergies No outpatient medications have been marked as taking for the 02/11/20 encounter (Office Visit) with Gatha Mayer, MD.   Past Medical History:  Diagnosis Date  . Allergic rhinitis, seasonal    mainly Spring  . Blood donor   . Elevated alkaline phosphatase level   . Premature ventricular contractions   . Renal calculus 1999   Past Surgical History:  Procedure Laterality Date  . COLONOSCOPY  2013   negative  . renal calculus OP  1999   passed spontaneously  . WISDOM TOOTH EXTRACTION    . WRIST FRACTURE SURGERY  1995   Left wrist   Social History   Social History Narrative   No diet; no caffeine   Married   Regular exercise- yes: 3 hrs of CVE and weights   family history includes Angelman syndrome in his daughter; Diabetes in his brother; Lung cancer in his maternal grandfather; Melanoma in his father; Skin cancer in his mother.   Review of Systems As per HPI  otherwise negative Objective:   Physical Exam BP 128/68   Pulse 78   Ht 6' (1.829 m)   Wt 175 lb (79.4 kg)   BMI 23.73 kg/m  Well-developed well-nourished white man in no acute distress  Rectal exam reveals no anoderm abnormalities digital exam is normal including prostate exam   Anoscopy demonstrates grade 2 internal hemorrhoids in all 3 positions.

## 2020-07-25 ENCOUNTER — Encounter: Payer: Self-pay | Admitting: Internal Medicine

## 2020-07-25 ENCOUNTER — Other Ambulatory Visit: Payer: Self-pay

## 2020-07-25 ENCOUNTER — Ambulatory Visit (INDEPENDENT_AMBULATORY_CARE_PROVIDER_SITE_OTHER): Payer: 59 | Admitting: Internal Medicine

## 2020-07-25 DIAGNOSIS — H1032 Unspecified acute conjunctivitis, left eye: Secondary | ICD-10-CM | POA: Insufficient documentation

## 2020-07-25 MED ORDER — POLYMYXIN B-TRIMETHOPRIM 10000-0.1 UNIT/ML-% OP SOLN
1.0000 [drp] | OPHTHALMIC | 0 refills | Status: DC
Start: 2020-07-25 — End: 2020-09-12

## 2020-07-25 NOTE — Progress Notes (Signed)
Subjective:    Patient ID: Timothy Miles, male    DOB: December 25, 1960, 60 y.o.   MRN: [id number]  HPI The patient is here for an acute visit.  Last Saturday he was trimming bushes with a hand saw and sawdust went into his left eye.  He flushed it and he was able to tell that there was no debris in the eye.  Last 2 days his eye has felt irritated, red and has been tearing.  He does have some discomfort with blinking.  He will have some crustiness and discharge, especially in the morning.  He denies any eye pain or changes in vision.  He denies any photophobia.  He thinks his symptoms are slightly better, but he is still having symptoms.     Medications and allergies reviewed with patient and updated if appropriate.  Patient Active Problem List   Diagnosis Date Noted   Prolapsed internal hemorrhoids, grade 2 - with transient bleeding 02/11/2020   Blood donor 05/20/2014   BPH (benign prostatic hyperplasia) 07/19/2013   NEPHROLITHIASIS, HX OF 04/26/2009   Allergic rhinitis 03/09/2007    No current outpatient medications on file prior to visit.   No current facility-administered medications on file prior to visit.    Past Medical History:  Diagnosis Date   Allergic rhinitis, seasonal    mainly Spring   Blood donor    Elevated alkaline phosphatase level    Premature ventricular contractions    Prolapsed internal hemorrhoids, grade 2 - with transient bleeding 02/11/2020   Renal calculus 1999    Past Surgical History:  Procedure Laterality Date   COLONOSCOPY  2013   negative   renal calculus OP  1999   passed spontaneously   WISDOM TOOTH EXTRACTION     WRIST FRACTURE SURGERY  1995   Left wrist    Social History   Socioeconomic History   Marital status: Married    Spouse name: Not on file   Number of children: Not on file   Years of education: Not on file   Highest education level: Not on file  Occupational History   Not on file  Tobacco Use   Smoking status:  Never   Smokeless tobacco: Never  Vaping Use   Vaping Use: Never used  Substance and Sexual Activity   Alcohol use: Yes    Alcohol/week: 1.0 standard drink    Types: 1 Glasses of wine per week    Comment: Social   Drug use: No   Sexual activity: Not on file  Other Topics Concern   Not on file  Social History Narrative   No diet; no caffeine   Married   Regular exercise- yes: 3 hrs of CVE and weights   Social Determinants of Health   Financial Resource Strain: Not on file  Food Insecurity: Not on file  Transportation Needs: Not on file  Physical Activity: Not on file  Stress: Not on file  Social Connections: Not on file    Family History  Problem Relation Age of Onset   Melanoma Father        intranasally   Skin cancer Mother        Basal cell   Diabetes Brother        diet controlled   Lung cancer Maternal Grandfather        smoker   Angelman syndrome Daughter        15th Chromosome partial deletion   Stroke Neg Hx  Heart disease Neg Hx    Colon cancer Neg Hx    Stomach cancer Neg Hx     Review of Systems  Constitutional:  Negative for chills and fever.  Eyes:  Positive for discharge, redness and itching. Negative for photophobia, pain and visual disturbance.      Objective:   Vitals:   07/25/20 1543  BP: 130/72  Pulse: 81  Temp: 98.5 F (36.9 C)  SpO2: 95%   BP Readings from Last 3 Encounters:  07/25/20 130/72  02/11/20 128/68  11/16/19 120/72   Wt Readings from Last 3 Encounters:  07/25/20 173 lb (78.5 kg)  02/11/20 175 lb (79.4 kg)  11/16/19 172 lb (78 kg)   Body mass index is 23.46 kg/m.   Physical Exam Constitutional:      General: He is not in acute distress.    Appearance: Normal appearance. He is not ill-appearing.  HENT:     Head: Normocephalic and atraumatic.  Eyes:     General: No scleral icterus.       Right eye: No discharge.        Left eye: No discharge.     Extraocular Movements: Extraocular movements intact.      Pupils: Pupils are equal, round, and reactive to light.     Comments: Left lateral conjunctiva slightly erythematous, minimal discharge from the corner, no lid swelling and left eye  Skin:    General: Skin is warm and dry.     Findings: No bruising or erythema.  Neurological:     Mental Status: He is alert.           Assessment & Plan:    See Problem List for Assessment and Plan of chronic medical problems.    This visit occurred during the SARS-CoV-2 public health emergency.  Safety protocols were in place, including screening questions prior to the visit, additional usage of staff PPE, and extensive cleaning of exam room while observing appropriate contact time as indicated for disinfecting solutions.

## 2020-07-25 NOTE — Patient Instructions (Addendum)
     Medications changes include :   antibacterial eye drops     Your prescription(s) have been submitted to your pharmacy. Please take as directed and contact our office if you believe you are having problem(s) with the medication(s).

## 2020-07-25 NOTE — Assessment & Plan Note (Signed)
Acute Debris went into his eye from trimming bushes-sawdust 3 days ago 2 days of symptoms suggestive of possible damage from foreign body with some symptoms of conjunctivitis Symptoms overall improving Still having some erythema and discharge Will treat with an antibiotic eyedrop for possible conjunctivitis If no improvement advised to see an ophthalmologist for further evaluation to rule out cornea injury

## 2020-09-11 NOTE — Progress Notes (Signed)
Subjective:    Patient ID: Timothy Miles, male    DOB: 1960/05/22, 60 y.o.   MRN: [id number]   This visit occurred during the SARS-CoV-2 public health emergency.  Safety protocols were in place, including screening questions prior to the visit, additional usage of staff PPE, and extensive cleaning of exam room while observing appropriate contact time as indicated for disinfecting solutions.   HPI He is here for a physical exam.   Having increased allergies.  Taking otc allergy medication.   No other concerns.  Medications and allergies reviewed with patient and updated if appropriate.  Patient Active Problem List   Diagnosis Date Noted   Acute conjunctivitis of left eye 07/25/2020   Prolapsed internal hemorrhoids, grade 2 - with transient bleeding 02/11/2020   Blood donor 05/20/2014   BPH (benign prostatic hyperplasia) 07/19/2013   NEPHROLITHIASIS, HX OF 04/26/2009   Allergic rhinitis 03/09/2007    No current outpatient medications on file prior to visit.   No current facility-administered medications on file prior to visit.    Past Medical History:  Diagnosis Date   Allergic rhinitis, seasonal    mainly Spring   Blood donor    Elevated alkaline phosphatase level    Premature ventricular contractions    Prolapsed internal hemorrhoids, grade 2 - with transient bleeding 02/11/2020   Renal calculus 1999    Past Surgical History:  Procedure Laterality Date   COLONOSCOPY  2013   negative   renal calculus OP  1999   passed spontaneously   WISDOM TOOTH EXTRACTION     WRIST FRACTURE SURGERY  1995   Left wrist    Social History   Socioeconomic History   Marital status: Married    Spouse name: Not on file   Number of children: Not on file   Years of education: Not on file   Highest education level: Not on file  Occupational History   Not on file  Tobacco Use   Smoking status: Never   Smokeless tobacco: Never  Vaping Use   Vaping Use: Never used   Substance and Sexual Activity   Alcohol use: Yes    Alcohol/week: 1.0 standard drink    Types: 1 Glasses of wine per week    Comment: Social   Drug use: No   Sexual activity: Not on file  Other Topics Concern   Not on file  Social History Narrative   No diet; no caffeine   Married   Regular exercise- yes: 3 hrs of CVE and weights   Social Determinants of Health   Financial Resource Strain: Not on file  Food Insecurity: Not on file  Transportation Needs: Not on file  Physical Activity: Not on file  Stress: Not on file  Social Connections: Not on file    Family History  Problem Relation Age of Onset   Melanoma Father        intranasally   Skin cancer Mother        Basal cell   Diabetes Brother        diet controlled   Lung cancer Maternal Grandfather        smoker   Angelman syndrome Daughter        15th Chromosome partial deletion   Stroke Neg Hx    Heart disease Neg Hx    Colon cancer Neg Hx    Stomach cancer Neg Hx     Review of Systems  Constitutional:  Negative for chills and fever.  Eyes:  Negative for visual disturbance.  Respiratory:  Negative for cough, shortness of breath and wheezing.   Cardiovascular:  Negative for chest pain, palpitations and leg swelling.  Gastrointestinal:  Negative for abdominal pain, blood in stool, constipation, diarrhea and nausea.       No gerd  Genitourinary:  Negative for difficulty urinating and dysuria.  Musculoskeletal:  Negative for arthralgias and back pain.  Skin:  Negative for color change and rash.  Neurological:  Negative for light-headedness and headaches.  Psychiatric/Behavioral:  Negative for dysphoric mood. The patient is not nervous/anxious.       Objective:   Vitals:   09/12/20 1259  BP: 138/76  Pulse: 77  Temp: 98.4 F (36.9 C)  SpO2: 95%   Filed Weights   09/12/20 1259  Weight: 174 lb (78.9 kg)   Body mass index is 23.6 kg/m.  BP Readings from Last 3 Encounters:  09/12/20 138/76   07/25/20 130/72  02/11/20 128/68    Wt Readings from Last 3 Encounters:  09/12/20 174 lb (78.9 kg)  07/25/20 173 lb (78.5 kg)  02/11/20 175 lb (79.4 kg)     Physical Exam Constitutional: He appears well-developed and well-nourished. No distress.  HENT:  Head: Normocephalic and atraumatic.  Right Ear: External ear normal.  Left Ear: External ear normal.  Mouth/Throat: Oropharynx is clear and moist.  Normal ear canals and TM b/l  Eyes: Conjunctivae and EOM are normal.  Neck: Neck supple. No tracheal deviation present. No thyromegaly present.  No carotid bruit  Cardiovascular: Normal rate, regular rhythm, normal heart sounds and intact distal pulses.   No murmur heard. Pulmonary/Chest: Effort normal and breath sounds normal. No respiratory distress. He has no wheezes. He has no rales.  Abdominal: Soft. He exhibits no distension. There is no tenderness.  Genitourinary: deferred  Musculoskeletal: He exhibits no edema.  Lymphadenopathy:   He has no cervical adenopathy.  Skin: Skin is warm and dry. He is not diaphoretic.  Psychiatric: He has a normal mood and affect. His behavior is normal.         Assessment & Plan:   Physical exam: Screening blood work  ordered Exercise    regular - running Weight  normal Substance abuse   none Saw derm recently  Reviewed recommended immunizations.   Health Maintenance  Topic Date Due   COVID-19 Vaccine (1) Never done   Zoster Vaccines- Shingrix (1 of 2) Never done   INFLUENZA VACCINE  08/14/2020   COLONOSCOPY (Pts 45-9yr Insurance coverage will need to be confirmed)  05/23/2022   TETANUS/TDAP  06/15/2029   Hepatitis C Screening  Completed   HIV Screening  Completed   Pneumococcal Vaccine [id number] old  Aged Out   HPV VACCINES  Aged Out     See Problem List for Assessment and Plan of chronic medical problems.

## 2020-09-11 NOTE — Patient Instructions (Addendum)
Blood work was ordered.     Medications changes include :  none     Please followup in 1 year   Health Maintenance, Male Adopting a healthy lifestyle and getting preventive care are important in promoting health and wellness. Ask your health care provider about: The right schedule for you to have regular tests and exams. Things you can do on your own to prevent diseases and keep yourself healthy. What should I know about diet, weight, and exercise? Eat a healthy diet  Eat a diet that includes plenty of vegetables, fruits, low-fat dairy products, and lean protein. Do not eat a lot of foods that are high in solid fats, added sugars, or sodium.  Maintain a healthy weight Body mass index (BMI) is a measurement that can be used to identify possible weight problems. It estimates body fat based on height and weight. Your health care provider can help determine your BMI and help you achieve or maintain ahealthy weight. Get regular exercise Get regular exercise. This is one of the most important things you can do for your health. Most adults should: Exercise for at least 150 minutes each week. The exercise should increase your heart rate and make you sweat (moderate-intensity exercise). Do strengthening exercises at least twice a week. This is in addition to the moderate-intensity exercise. Spend less time sitting. Even light physical activity can be beneficial. Watch cholesterol and blood lipids Have your blood tested for lipids and cholesterol at 59 years of age, then havethis test every 5 years. You may need to have your cholesterol levels checked more often if: Your lipid or cholesterol levels are high. You are older than 59 years of age. You are at high risk for heart disease. What should I know about cancer screening? Many types of cancers can be detected early and may often be prevented. Depending on your health history and family history, you may need to have cancer screening at  various ages. This may include screening for: Colorectal cancer. Prostate cancer. Skin cancer. Lung cancer. What should I know about heart disease, diabetes, and high blood pressure? Blood pressure and heart disease High blood pressure causes heart disease and increases the risk of stroke. This is more likely to develop in people who have high blood pressure readings, are of African descent, or are overweight. Talk with your health care provider about your target blood pressure readings. Have your blood pressure checked: Every 3-5 years if you are 52-47 years of age. Every year if you are 75 years old or older. If you are between the ages of 61 and 62 and are a current or former smoker, ask your health care provider if you should have a one-time screening for abdominal aortic aneurysm (AAA). Diabetes Have regular diabetes screenings. This checks your fasting blood sugar level. Have the screening done: Once every three years after age 62 if you are at a normal weight and have a low risk for diabetes. More often and at a younger age if you are overweight or have a high risk for diabetes. What should I know about preventing infection? Hepatitis B If you have a higher risk for hepatitis B, you should be screened for this virus. Talk with your health care provider to find out if you are at risk forhepatitis B infection. Hepatitis C Blood testing is recommended for: Everyone born from [birth year] through 1964. Anyone with known risk factors for hepatitis C. Sexually transmitted infections (STIs) You should be screened each year for STIs,  including gonorrhea and chlamydia, if: You are sexually active and are younger than 59 years of age. You are older than 59 years of age and your health care provider tells you that you are at risk for this type of infection. Your sexual activity has changed since you were last screened, and you are at increased risk for chlamydia or gonorrhea. Ask your health care  provider if you are at risk. Ask your health care provider about whether you are at high risk for HIV. Your health care provider may recommend a prescription medicine to help prevent HIV infection. If you choose to take medicine to prevent HIV, you should first get tested for HIV. You should then be tested every 3 months for as long as you are taking the medicine. Follow these instructions at home: Lifestyle Do not use any products that contain nicotine or tobacco, such as cigarettes, e-cigarettes, and chewing tobacco. If you need help quitting, ask your health care provider. Do not use street drugs. Do not share needles. Ask your health care provider for help if you need support or information about quitting drugs. Alcohol use Do not drink alcohol if your health care provider tells you not to drink. If you drink alcohol: Limit how much you have to 0-2 drinks a day. Be aware of how much alcohol is in your drink. In the [place] one drink equals one 12 oz bottle of beer (355 mL), one 5 oz glass of wine (148 mL), or one 1 oz glass of hard liquor (44 mL). General instructions Schedule regular health, dental, and eye exams. Stay current with your vaccines. Tell your health care provider if: You often feel depressed. You have ever been abused or do not feel safe at home. Summary Adopting a healthy lifestyle and getting preventive care are important in promoting health and wellness. Follow your health care provider's instructions about healthy diet, exercising, and getting tested or screened for diseases. Follow your health care provider's instructions on monitoring your cholesterol and blood pressure. This information is not intended to replace advice given to you by your health care provider. Make sure you discuss any questions you have with your healthcare provider. Document Revised: 12/24/2017 Document Reviewed: 12/24/2017 Elsevier Patient Education  2022 [employer].

## 2020-09-12 ENCOUNTER — Encounter: Payer: Self-pay | Admitting: Internal Medicine

## 2020-09-12 ENCOUNTER — Ambulatory Visit (INDEPENDENT_AMBULATORY_CARE_PROVIDER_SITE_OTHER): Payer: 59 | Admitting: Internal Medicine

## 2020-09-12 ENCOUNTER — Other Ambulatory Visit: Payer: Self-pay

## 2020-09-12 VITALS — BP 138/76 | HR 77 | Temp 98.4°F | Ht 72.0 in | Wt 174.0 lb

## 2020-09-12 DIAGNOSIS — Z0000 Encounter for general adult medical examination without abnormal findings: Secondary | ICD-10-CM

## 2020-09-12 DIAGNOSIS — N400 Enlarged prostate without lower urinary tract symptoms: Secondary | ICD-10-CM | POA: Diagnosis not present

## 2020-09-12 LAB — CBC WITH DIFFERENTIAL/PLATELET
Basophils Absolute: 0 10*3/uL (ref 0.0–0.1)
Basophils Relative: 0.8 % (ref 0.0–3.0)
Eosinophils Absolute: 0.3 10*3/uL (ref 0.0–0.7)
Eosinophils Relative: 4.7 % (ref 0.0–5.0)
HCT: 43.3 % (ref 39.0–52.0)
Hemoglobin: 14.7 g/dL (ref 13.0–17.0)
Lymphocytes Relative: 25 % (ref 12.0–46.0)
Lymphs Abs: 1.5 10*3/uL (ref 0.7–4.0)
MCHC: 33.9 g/dL (ref 30.0–36.0)
MCV: 83.6 fl (ref 78.0–100.0)
Monocytes Absolute: 0.5 10*3/uL (ref 0.1–1.0)
Monocytes Relative: 8.4 % (ref 3.0–12.0)
Neutro Abs: 3.6 10*3/uL (ref 1.4–7.7)
Neutrophils Relative %: 61.1 % (ref 43.0–77.0)
Platelets: 298 10*3/uL (ref 150.0–400.0)
RBC: 5.19 Mil/uL (ref 4.22–5.81)
RDW: 13.3 % (ref 11.5–15.5)
WBC: 5.9 10*3/uL (ref 4.0–10.5)

## 2020-09-12 LAB — COMPREHENSIVE METABOLIC PANEL
ALT: 24 U/L (ref 0–53)
AST: 23 U/L (ref 0–37)
Albumin: 4 g/dL (ref 3.5–5.2)
Alkaline Phosphatase: 119 U/L — ABNORMAL HIGH (ref 39–117)
BUN: 21 mg/dL (ref 6–23)
CO2: 27 mEq/L (ref 19–32)
Calcium: 9.4 mg/dL (ref 8.4–10.5)
Chloride: 105 mEq/L (ref 96–112)
Creatinine, Ser: 1.06 mg/dL (ref 0.40–1.50)
GFR: 77.05 mL/min (ref >60.00)
Glucose, Bld: 78 mg/dL (ref 70–99)
Potassium: 4.1 mEq/L (ref 3.5–5.1)
Sodium: 140 mEq/L (ref 135–145)
Total Bilirubin: 0.7 mg/dL (ref 0.2–1.2)
Total Protein: 6.9 g/dL (ref 6.0–8.3)

## 2020-09-12 LAB — LIPID PANEL
Cholesterol: 191 mg/dL (ref 0–200)
HDL: 39.3 mg/dL (ref >39.00)
LDL Cholesterol: 116 mg/dL — ABNORMAL HIGH (ref 0–99)
NonHDL: 151.97
Total CHOL/HDL Ratio: 5
Triglycerides: 178 mg/dL — ABNORMAL HIGH (ref 0.0–149.0)
VLDL: 35.6 mg/dL (ref 0.0–40.0)

## 2020-09-12 NOTE — Assessment & Plan Note (Signed)
Chronic No LUTS psa today

## 2020-09-13 LAB — PSA, TOTAL AND FREE
PSA, % Free: 19 % (calc) — ABNORMAL LOW (ref >25)
PSA, Free: 0.3 ng/mL
PSA, Total: 1.6 ng/mL (ref <=4.0)

## 2020-09-13 LAB — TSH: TSH: 0.71 u[IU]/mL (ref 0.35–5.50)

## 2021-09-12 NOTE — Progress Notes (Unsigned)
    Subjective:    Patient ID: Timothy Miles, male    DOB: 1960-02-26, 61 y.o.   MRN: [id number]     HPI Timothy Miles is here for a physical exam.   No changes in his health.  No concerns.    Medications and allergies reviewed with patient and updated if appropriate.  No current outpatient medications on file prior to visit.   No current facility-administered medications on file prior to visit.    Review of Systems  Constitutional:  Negative for chills and fever.  Eyes:  Negative for visual disturbance.  Respiratory:  Negative for cough, shortness of breath and wheezing.   Cardiovascular:  Negative for chest pain, palpitations and leg swelling.  Gastrointestinal:  Negative for abdominal pain, blood in stool, constipation, diarrhea and nausea.       No gerd  Genitourinary:  Negative for difficulty urinating, dysuria and hematuria.  Musculoskeletal:  Negative for arthralgias and back pain.       Left PIP joint swelling  Skin:  Negative for rash.  Neurological:  Negative for dizziness, light-headedness and headaches.  Psychiatric/Behavioral:  Negative for dysphoric mood. The patient is not nervous/anxious.        Objective:   Vitals:   09/13/21 0753  BP: 114/60  Pulse: 65  Temp: 98.1 F (36.7 C)  SpO2: 97%   Filed Weights   09/13/21 0753  Weight: 170 lb (77.1 kg)   Body mass index is 23.06 kg/m.  BP Readings from Last 3 Encounters:  09/13/21 114/60  09/12/20 138/76  07/25/20 130/72    Wt Readings from Last 3 Encounters:  09/13/21 170 lb (77.1 kg)  09/12/20 174 lb (78.9 kg)  07/25/20 173 lb (78.5 kg)      Physical Exam Constitutional: He appears well-developed and well-nourished. No distress.  HENT:  Head: Normocephalic and atraumatic.  Right Ear: External ear normal.  Left Ear: External ear normal.  Mouth/Throat: Oropharynx is clear and moist.  Normal ear canals and TM b/l  Eyes: Conjunctivae and EOM are normal.  Neck: Neck supple. No tracheal  deviation present. No thyromegaly present.  No carotid bruit  Cardiovascular: Normal rate, regular rhythm, normal heart sounds and intact distal pulses.   No murmur heard. Pulmonary/Chest: Effort normal and breath sounds normal. No respiratory distress. He has no wheezes. He has no rales.  Abdominal: Soft. He exhibits no distension. There is no tenderness.  Genitourinary: deferred  Musculoskeletal: He exhibits no edema.  Lymphadenopathy:   He has no cervical adenopathy.  Skin: Skin is warm and dry. He is not diaphoretic.  Psychiatric: He has a normal mood and affect. His behavior is normal.         Assessment & Plan:   Physical exam: Screening blood work  ordered Exercise   running some, walking Weight  normal Substance abuse   none   Reviewed recommended immunizations.   Health Maintenance  Topic Date Due   COVID-19 Vaccine (1) Never done   Zoster Vaccines- Shingrix (1 of 2) Never done   INFLUENZA VACCINE  04/14/2022 (Originally 08/14/2021)   COLONOSCOPY (Pts 45-33yr Insurance coverage will need to be confirmed)  05/23/2022   TETANUS/TDAP  06/15/2029   Hepatitis C Screening  Completed   HIV Screening  Completed   HPV VACCINES  Aged Out     See Problem List for Assessment and Plan of chronic medical problems.

## 2021-09-12 NOTE — Patient Instructions (Addendum)
Blood work was ordered.     Medications changes include :   none    Return in about 1 year (around 09/14/2022) for Physical Exam.   Health Maintenance, Male Adopting a healthy lifestyle and getting preventive care are important in promoting health and wellness. Ask your health care provider about: The right schedule for you to have regular tests and exams. Things you can do on your own to prevent diseases and keep yourself healthy. What should I know about diet, weight, and exercise? Eat a healthy diet  Eat a diet that includes plenty of vegetables, fruits, low-fat dairy products, and lean protein. Do not eat a lot of foods that are high in solid fats, added sugars, or sodium. Maintain a healthy weight Body mass index (BMI) is a measurement that can be used to identify possible weight problems. It estimates body fat based on height and weight. Your health care provider can help determine your BMI and help you achieve or maintain a healthy weight. Get regular exercise Get regular exercise. This is one of the most important things you can do for your health. Most adults should: Exercise for at least 150 minutes each week. The exercise should increase your heart rate and make you sweat (moderate-intensity exercise). Do strengthening exercises at least twice a week. This is in addition to the moderate-intensity exercise. Spend less time sitting. Even light physical activity can be beneficial. Watch cholesterol and blood lipids Have your blood tested for lipids and cholesterol at 60 years of age, then have this test every 5 years. You may need to have your cholesterol levels checked more often if: Your lipid or cholesterol levels are high. You are older than 61 years of age. You are at high risk for heart disease. What should I know about cancer screening? Many types of cancers can be detected early and may often be prevented. Depending on your health history and family history,  you may need to have cancer screening at various ages. This may include screening for: Colorectal cancer. Prostate cancer. Skin cancer. Lung cancer. What should I know about heart disease, diabetes, and high blood pressure? Blood pressure and heart disease High blood pressure causes heart disease and increases the risk of stroke. This is more likely to develop in people who have high blood pressure readings or are overweight. Talk with your health care provider about your target blood pressure readings. Have your blood pressure checked: Every 3-5 years if you are 72-84 years of age. Every year if you are 77 years old or older. If you are between the ages of 39 and 68 and are a current or former smoker, ask your health care provider if you should have a one-time screening for abdominal aortic aneurysm (AAA). Diabetes Have regular diabetes screenings. This checks your fasting blood sugar level. Have the screening done: Once every three years after age 19 if you are at a normal weight and have a low risk for diabetes. More often and at a younger age if you are overweight or have a high risk for diabetes. What should I know about preventing infection? Hepatitis B If you have a higher risk for hepatitis B, you should be screened for this virus. Talk with your health care provider to find out if you are at risk for hepatitis B infection. Hepatitis C Blood testing is recommended for: Everyone born from [birth year] through 1965. Anyone with known risk factors for hepatitis C. Sexually transmitted infections (STIs) You should  be screened each year for STIs, including gonorrhea and chlamydia, if: You are sexually active and are younger than 60 years of age. You are older than 60 years of age and your health care provider tells you that you are at risk for this type of infection. Your sexual activity has changed since you were last screened, and you are at increased risk for chlamydia or gonorrhea. Ask  your health care provider if you are at risk. Ask your health care provider about whether you are at high risk for HIV. Your health care provider may recommend a prescription medicine to help prevent HIV infection. If you choose to take medicine to prevent HIV, you should first get tested for HIV. You should then be tested every 3 months for as long as you are taking the medicine. Follow these instructions at home: Alcohol use Do not drink alcohol if your health care provider tells you not to drink. If you drink alcohol: Limit how much you have to 0-2 drinks a day. Know how much alcohol is in your drink. In the [place] one drink equals one 12 oz bottle of beer (355 mL), one 5 oz glass of wine (148 mL), or one 1 oz glass of hard liquor (44 mL). Lifestyle Do not use any products that contain nicotine or tobacco. These products include cigarettes, chewing tobacco, and vaping devices, such as e-cigarettes. If you need help quitting, ask your health care provider. Do not use street drugs. Do not share needles. Ask your health care provider for help if you need support or information about quitting drugs. General instructions Schedule regular health, dental, and eye exams. Stay current with your vaccines. Tell your health care provider if: You often feel depressed. You have ever been abused or do not feel safe at home. Summary Adopting a healthy lifestyle and getting preventive care are important in promoting health and wellness. Follow your health care provider's instructions about healthy diet, exercising, and getting tested or screened for diseases. Follow your health care provider's instructions on monitoring your cholesterol and blood pressure. This information is not intended to replace advice given to you by your health care provider. Make sure you discuss any questions you have with your health care provider. Document Revised: 05/22/2020 Document Reviewed: 05/22/2020 Elsevier Patient  Education  [place].

## 2021-09-13 ENCOUNTER — Ambulatory Visit (INDEPENDENT_AMBULATORY_CARE_PROVIDER_SITE_OTHER): Payer: 59 | Admitting: Internal Medicine

## 2021-09-13 ENCOUNTER — Encounter: Payer: Self-pay | Admitting: Internal Medicine

## 2021-09-13 VITALS — BP 114/60 | HR 65 | Temp 98.1°F | Ht 72.0 in | Wt 170.0 lb

## 2021-09-13 DIAGNOSIS — Z0000 Encounter for general adult medical examination without abnormal findings: Secondary | ICD-10-CM | POA: Diagnosis not present

## 2021-09-13 DIAGNOSIS — Z125 Encounter for screening for malignant neoplasm of prostate: Secondary | ICD-10-CM | POA: Diagnosis not present

## 2021-09-13 LAB — COMPREHENSIVE METABOLIC PANEL
ALT: 22 U/L (ref 0–53)
AST: 18 U/L (ref 0–37)
Albumin: 4.3 g/dL (ref 3.5–5.2)
Alkaline Phosphatase: 122 U/L — ABNORMAL HIGH (ref 39–117)
BUN: 19 mg/dL (ref 6–23)
CO2: 29 mEq/L (ref 19–32)
Calcium: 9.5 mg/dL (ref 8.4–10.5)
Chloride: 104 mEq/L (ref 96–112)
Creatinine, Ser: 1.14 mg/dL (ref 0.40–1.50)
GFR: 70.11 mL/min (ref >60.00)
Glucose, Bld: 88 mg/dL (ref 70–99)
Potassium: 4.8 mEq/L (ref 3.5–5.1)
Sodium: 139 mEq/L (ref 135–145)
Total Bilirubin: 0.8 mg/dL (ref 0.2–1.2)
Total Protein: 7 g/dL (ref 6.0–8.3)

## 2021-09-13 LAB — CBC WITH DIFFERENTIAL/PLATELET
Basophils Absolute: 0 10*3/uL (ref 0.0–0.1)
Basophils Relative: 0.8 % (ref 0.0–3.0)
Eosinophils Absolute: 0.2 10*3/uL (ref 0.0–0.7)
Eosinophils Relative: 4 % (ref 0.0–5.0)
HCT: 46.1 % (ref 39.0–52.0)
Hemoglobin: 15.6 g/dL (ref 13.0–17.0)
Lymphocytes Relative: 26.4 % (ref 12.0–46.0)
Lymphs Abs: 1.3 10*3/uL (ref 0.7–4.0)
MCHC: 33.9 g/dL (ref 30.0–36.0)
MCV: 84.1 fl (ref 78.0–100.0)
Monocytes Absolute: 0.5 10*3/uL (ref 0.1–1.0)
Monocytes Relative: 9.3 % (ref 3.0–12.0)
Neutro Abs: 3 10*3/uL (ref 1.4–7.7)
Neutrophils Relative %: 59.5 % (ref 43.0–77.0)
Platelets: 264 10*3/uL (ref 150.0–400.0)
RBC: 5.48 Mil/uL (ref 4.22–5.81)
RDW: 13.4 % (ref 11.5–15.5)
WBC: 5 10*3/uL (ref 4.0–10.5)

## 2021-09-13 LAB — LIPID PANEL
Cholesterol: 222 mg/dL — ABNORMAL HIGH (ref 0–200)
HDL: 43.3 mg/dL (ref >39.00)
LDL Cholesterol: 147 mg/dL — ABNORMAL HIGH (ref 0–99)
NonHDL: 178.9
Total CHOL/HDL Ratio: 5
Triglycerides: 160 mg/dL — ABNORMAL HIGH (ref 0.0–149.0)
VLDL: 32 mg/dL (ref 0.0–40.0)

## 2021-09-13 LAB — TSH: TSH: 0.95 u[IU]/mL (ref 0.35–5.50)

## 2021-09-13 LAB — PSA: PSA: 1.05 ng/mL (ref 0.10–4.00)

## 2022-07-05 ENCOUNTER — Encounter: Payer: Self-pay | Admitting: Internal Medicine

## 2022-08-29 ENCOUNTER — Encounter (INDEPENDENT_AMBULATORY_CARE_PROVIDER_SITE_OTHER): Payer: Self-pay

## 2022-09-06 ENCOUNTER — Encounter: Payer: 59 | Admitting: Internal Medicine

## 2022-09-17 ENCOUNTER — Encounter: Payer: 59 | Admitting: Internal Medicine

## 2022-09-24 ENCOUNTER — Encounter: Payer: Self-pay | Admitting: Internal Medicine

## 2022-09-24 NOTE — Progress Notes (Unsigned)
Subjective:    Patient ID: Timothy Miles, male    DOB: 10/14/1960, 61 y.o.   MRN: [id number]     HPI Timothy Miles is here for a physical exam and his chronic medical problems.   New job - 40 hrs a week -which is less than what he was working now and has more time to do other things.  Has been a good overall switch.  New concerns   Medications and allergies reviewed with patient and updated if appropriate.  No current outpatient medications on file prior to visit.   No current facility-administered medications on file prior to visit.    Review of Systems  Constitutional:  Negative for fever.  Eyes:  Negative for visual disturbance.  Respiratory:  Negative for cough, shortness of breath and wheezing.   Cardiovascular:  Negative for chest pain, palpitations and leg swelling.  Gastrointestinal:  Negative for abdominal pain, blood in stool, constipation and diarrhea.       No gerd  Genitourinary:  Negative for difficulty urinating and hematuria.  Musculoskeletal:  Negative for arthralgias and back pain.       Tenderness medial right wrist  Skin:  Negative for rash.  Neurological:  Negative for light-headedness and headaches.  Psychiatric/Behavioral:  Negative for dysphoric mood. The patient is not nervous/anxious.        Objective:   Vitals:   09/25/22 1515  BP: 118/78  Pulse: 72  Temp: 97.9 F (36.6 C)  SpO2: 95%   Filed Weights   09/25/22 1515  Weight: 173 lb 12.8 oz (78.8 kg)   Body mass index is 23.57 kg/m.  BP Readings from Last 3 Encounters:  09/25/22 118/78  09/13/21 114/60  09/12/20 138/76    Wt Readings from Last 3 Encounters:  09/25/22 173 lb 12.8 oz (78.8 kg)  09/13/21 170 lb (77.1 kg)  09/12/20 174 lb (78.9 kg)      Physical Exam Constitutional: He appears well-developed and well-nourished. No distress.  HENT:  Head: Normocephalic and atraumatic.  Right Ear: External ear normal.  Left Ear: External ear normal.  Normal ear canals and TM  b/l  Mouth/Throat: Oropharynx is clear and moist. Eyes: Conjunctivae and EOM are normal.  Neck: Neck supple. No tracheal deviation present. No thyromegaly present.  No carotid bruit  Cardiovascular: Normal rate, regular rhythm, normal heart sounds and intact distal pulses.   No murmur heard.  No lower extremity edema. Pulmonary/Chest: Effort normal and breath sounds normal. No respiratory distress. He has no wheezes. He has no rales.  Abdominal: Soft. He exhibits no distension. There is no tenderness.  Genitourinary: deferred  Lymphadenopathy:   He has no cervical adenopathy.  Skin: Skin is warm and dry. He is not diaphoretic.  Psychiatric: He has a normal mood and affect. His behavior is normal.         Assessment & Plan:   Physical exam: Screening blood work  ordered Exercise    regular - walking, some running Weight  normal Substance abuse   none   Reviewed recommended immunizations.   Health Maintenance  Topic Date Due   Zoster Vaccines- Shingrix (1 of 2) Never done   Colonoscopy  05/23/2022   COVID-19 Vaccine (1 - 2023-24 season) 10/11/2022 (Originally 09/15/2022)   INFLUENZA VACCINE  04/14/2023 (Originally 08/15/2022)   DTaP/Tdap/Td (4 - Td or Tdap) 06/15/2029   Hepatitis C Screening  Completed   HIV Screening  Completed   HPV VACCINES  Aged Out   Colonoscopy scheduled  See Problem List for Assessment and Plan of chronic medical problems.

## 2022-09-24 NOTE — Patient Instructions (Addendum)
Blood work was ordered.   The lab is on the first floor.    Medications changes include :   none    A referral was ordered and someone will call you to schedule an appointment.     Return in about 1 year (around 09/25/2023) for Physical Exam.    Health Maintenance, Male Adopting a healthy lifestyle and getting preventive care are important in promoting health and wellness. Ask your health care provider about: The right schedule for you to have regular tests and exams. Things you can do on your own to prevent diseases and keep yourself healthy. What should I know about diet, weight, and exercise? Eat a healthy diet  Eat a diet that includes plenty of vegetables, fruits, low-fat dairy products, and lean protein. Do not eat a lot of foods that are high in solid fats, added sugars, or sodium. Maintain a healthy weight Body mass index (BMI) is a measurement that can be used to identify possible weight problems. It estimates body fat based on height and weight. Your health care provider can help determine your BMI and help you achieve or maintain a healthy weight. Get regular exercise Get regular exercise. This is one of the most important things you can do for your health. Most adults should: Exercise for at least 150 minutes each week. The exercise should increase your heart rate and make you sweat (moderate-intensity exercise). Do strengthening exercises at least twice a week. This is in addition to the moderate-intensity exercise. Spend less time sitting. Even light physical activity can be beneficial. Watch cholesterol and blood lipids Have your blood tested for lipids and cholesterol at 61 years of age, then have this test every 5 years. You may need to have your cholesterol levels checked more often if: Your lipid or cholesterol levels are high. You are older than 61 years of age. You are at high risk for heart disease. What should I know about cancer screening? Many  types of cancers can be detected early and may often be prevented. Depending on your health history and family history, you may need to have cancer screening at various ages. This may include screening for: Colorectal cancer. Prostate cancer. Skin cancer. Lung cancer. What should I know about heart disease, diabetes, and high blood pressure? Blood pressure and heart disease High blood pressure causes heart disease and increases the risk of stroke. This is more likely to develop in people who have high blood pressure readings or are overweight. Talk with your health care provider about your target blood pressure readings. Have your blood pressure checked: Every 3-5 years if you are 21-15 years of age. Every year if you are 48 years old or older. If you are between the ages of 52 and 92 and are a current or former smoker, ask your health care provider if you should have a one-time screening for abdominal aortic aneurysm (AAA). Diabetes Have regular diabetes screenings. This checks your fasting blood sugar level. Have the screening done: Once every three years after age 71 if you are at a normal weight and have a low risk for diabetes. More often and at a younger age if you are overweight or have a high risk for diabetes. What should I know about preventing infection? Hepatitis B If you have a higher risk for hepatitis B, you should be screened for this virus. Talk with your health care provider to find out if you are at risk for hepatitis B infection.  Hepatitis C Blood testing is recommended for: Everyone born from [birth year] through 1965. Anyone with known risk factors for hepatitis C. Sexually transmitted infections (STIs) You should be screened each year for STIs, including gonorrhea and chlamydia, if: You are sexually active and are younger than 61 years of age. You are older than 61 years of age and your health care provider tells you that you are at risk for this type of infection. Your  sexual activity has changed since you were last screened, and you are at increased risk for chlamydia or gonorrhea. Ask your health care provider if you are at risk. Ask your health care provider about whether you are at high risk for HIV. Your health care provider may recommend a prescription medicine to help prevent HIV infection. If you choose to take medicine to prevent HIV, you should first get tested for HIV. You should then be tested every 3 months for as long as you are taking the medicine. Follow these instructions at home: Alcohol use Do not drink alcohol if your health care provider tells you not to drink. If you drink alcohol: Limit how much you have to 0-2 drinks a day. Know how much alcohol is in your drink. In the [place] one drink equals one 12 oz bottle of beer (355 mL), one 5 oz glass of wine (148 mL), or one 1 oz glass of hard liquor (44 mL). Lifestyle Do not use any products that contain nicotine or tobacco. These products include cigarettes, chewing tobacco, and vaping devices, such as e-cigarettes. If you need help quitting, ask your health care provider. Do not use street drugs. Do not share needles. Ask your health care provider for help if you need support or information about quitting drugs. General instructions Schedule regular health, dental, and eye exams. Stay current with your vaccines. Tell your health care provider if: You often feel depressed. You have ever been abused or do not feel safe at home. Summary Adopting a healthy lifestyle and getting preventive care are important in promoting health and wellness. Follow your health care provider's instructions about healthy diet, exercising, and getting tested or screened for diseases. Follow your health care provider's instructions on monitoring your cholesterol and blood pressure. This information is not intended to replace advice given to you by your health care provider. Make sure you discuss any questions you  have with your health care provider. Document Revised: 05/22/2020 Document Reviewed: 05/22/2020 Elsevier Patient Education  2024 [employer].

## 2022-09-25 ENCOUNTER — Encounter: Payer: Self-pay | Admitting: Internal Medicine

## 2022-09-25 ENCOUNTER — Ambulatory Visit (INDEPENDENT_AMBULATORY_CARE_PROVIDER_SITE_OTHER): Payer: 59 | Admitting: Internal Medicine

## 2022-09-25 VITALS — BP 118/78 | HR 72 | Temp 97.9°F | Ht 72.0 in | Wt 173.8 lb

## 2022-09-25 DIAGNOSIS — Z0000 Encounter for general adult medical examination without abnormal findings: Secondary | ICD-10-CM

## 2022-09-25 DIAGNOSIS — Z13220 Encounter for screening for lipoid disorders: Secondary | ICD-10-CM

## 2022-09-25 DIAGNOSIS — Z125 Encounter for screening for malignant neoplasm of prostate: Secondary | ICD-10-CM

## 2022-09-25 LAB — PSA: PSA: 1.9 ng/mL (ref 0.10–4.00)

## 2022-09-25 LAB — COMPREHENSIVE METABOLIC PANEL
ALT: 20 U/L (ref 0–53)
AST: 18 U/L (ref 0–37)
Albumin: 4.3 g/dL (ref 3.5–5.2)
Alkaline Phosphatase: 115 U/L (ref 39–117)
BUN: 18 mg/dL (ref 6–23)
CO2: 29 meq/L (ref 19–32)
Calcium: 9.8 mg/dL (ref 8.4–10.5)
Chloride: 104 meq/L (ref 96–112)
Creatinine, Ser: 1.1 mg/dL (ref 0.40–1.50)
GFR: 72.65 mL/min (ref >60.00)
Glucose, Bld: 83 mg/dL (ref 70–99)
Potassium: 4.7 meq/L (ref 3.5–5.1)
Sodium: 139 meq/L (ref 135–145)
Total Bilirubin: 0.5 mg/dL (ref 0.2–1.2)
Total Protein: 7 g/dL (ref 6.0–8.3)

## 2022-09-25 LAB — CBC WITH DIFFERENTIAL/PLATELET
Basophils Absolute: 0.1 10*3/uL (ref 0.0–0.1)
Basophils Relative: 0.9 % (ref 0.0–3.0)
Eosinophils Absolute: 0.2 10*3/uL (ref 0.0–0.7)
Eosinophils Relative: 2.9 % (ref 0.0–5.0)
HCT: 46.7 % (ref 39.0–52.0)
Hemoglobin: 15.6 g/dL (ref 13.0–17.0)
Lymphocytes Relative: 29.7 % (ref 12.0–46.0)
Lymphs Abs: 1.7 10*3/uL (ref 0.7–4.0)
MCHC: 33.3 g/dL (ref 30.0–36.0)
MCV: 84.5 fl (ref 78.0–100.0)
Monocytes Absolute: 0.5 10*3/uL (ref 0.1–1.0)
Monocytes Relative: 9.3 % (ref 3.0–12.0)
Neutro Abs: 3.3 10*3/uL (ref 1.4–7.7)
Neutrophils Relative %: 57.2 % (ref 43.0–77.0)
Platelets: 275 10*3/uL (ref 150.0–400.0)
RBC: 5.53 Mil/uL (ref 4.22–5.81)
RDW: 13.1 % (ref 11.5–15.5)
WBC: 5.7 10*3/uL (ref 4.0–10.5)

## 2022-09-25 LAB — LIPID PANEL
Cholesterol: 200 mg/dL (ref 0–200)
HDL: 43.4 mg/dL (ref >39.00)
LDL Cholesterol: 87 mg/dL (ref 0–99)
NonHDL: 156.68
Total CHOL/HDL Ratio: 5
Triglycerides: 347 mg/dL — ABNORMAL HIGH (ref 0.0–149.0)
VLDL: 69.4 mg/dL — ABNORMAL HIGH (ref 0.0–40.0)

## 2022-09-25 LAB — TSH: TSH: 1.56 u[IU]/mL (ref 0.35–5.50)

## 2022-10-25 ENCOUNTER — Ambulatory Visit (AMBULATORY_SURGERY_CENTER): Payer: 59

## 2022-10-25 ENCOUNTER — Encounter: Payer: Self-pay | Admitting: Internal Medicine

## 2022-10-25 VITALS — Ht 72.0 in | Wt 165.0 lb

## 2022-10-25 DIAGNOSIS — Z1211 Encounter for screening for malignant neoplasm of colon: Secondary | ICD-10-CM

## 2022-10-25 MED ORDER — NA SULFATE-K SULFATE-MG SULF 17.5-3.13-1.6 GM/177ML PO SOLN
1.0000 | Freq: Once | ORAL | 0 refills | Status: AC
Start: 2022-10-25 — End: 2022-10-25

## 2022-10-25 NOTE — Progress Notes (Signed)

## 2022-11-09 ENCOUNTER — Encounter: Payer: Self-pay | Admitting: Certified Registered Nurse Anesthetist

## 2022-11-14 NOTE — Progress Notes (Signed)
Gastroenterology History and Physical   Primary Care Physician:  Pincus Sanes, MD   Reason for Procedure:   CRCA screening  Plan:    colonoscopy     HPI: Timothy Miles is a 62 y.o. male here for a screening exam. 2014 colonoscopy w/o polyps.   Past Medical History:  Diagnosis Date   Allergic rhinitis, seasonal    mainly Spring   Blood donor    Elevated alkaline phosphatase level    Premature ventricular contractions    Prolapsed internal hemorrhoids, grade 2 - with transient bleeding 02/11/2020   Renal calculus 1999    Past Surgical History:  Procedure Laterality Date   COLONOSCOPY  2013   negative   renal calculus OP  1999   passed spontaneously   WISDOM TOOTH EXTRACTION     WRIST FRACTURE SURGERY  1995   Left wrist    Prior to Admission medications   Not on File    No current outpatient medications on file.   Current Facility-Administered Medications  Medication Dose Route Frequency Provider Last Rate Last Admin   0.9 %  sodium chloride infusion  500 mL Intravenous Once Iva Boop, MD        Allergies as of 11/15/2022 - Review Complete 11/15/2022  Allergen Reaction Noted   Tape  02/11/2020    Family History  Problem Relation Age of Onset   Skin cancer Mother        Basal cell   Melanoma Father        intranasally   Diabetes Brother        diet controlled   Lung cancer Maternal Grandfather        smoker   Angelman syndrome Daughter        15th Chromosome partial deletion   Stroke Neg Hx    Heart disease Neg Hx    Colon cancer Neg Hx    Stomach cancer Neg Hx    Rectal cancer Neg Hx     Social History   Socioeconomic History   Marital status: Married    Spouse name: Not on file   Number of children: Not on file   Years of education: Not on file   Highest education level: Not on file  Occupational History   Not on file  Tobacco Use   Smoking status: Never   Smokeless tobacco: Never  Vaping Use   Vaping status: Never  Used  Substance and Sexual Activity   Alcohol use: Yes    Alcohol/week: 1.0 standard drink of alcohol    Types: 1 Glasses of wine per week    Comment: Social   Drug use: No   Sexual activity: Not on file  Other Topics Concern   Not on file  Social History Narrative   No diet; no caffeine   Married   Regular exercise- yes: 3 hrs of CVE and weights   Social Determinants of Health   Financial Resource Strain: Not on file  Food Insecurity: Not on file  Transportation Needs: Not on file  Physical Activity: Not on file  Stress: Not on file  Social Connections: Not on file  Intimate Partner Violence: Not on file    Review of Systems:  All other review of systems negative except as mentioned in the HPI.  Physical Exam: Vital signs BP (!) 158/84   Pulse 67   Temp 98.1 F (36.7 C)   Resp 20   Ht 6' (1.829 m)   [employer]  163 lb 6.4 oz (74.1 kg)   SpO2 100%   BMI 22.16 kg/m   General:   Alert,  Well-developed, well-nourished, pleasant and cooperative in NAD Lungs:  Clear throughout to auscultation.   Heart:  Regular rate and rhythm; no murmurs, clicks, rubs,  or gallops. Abdomen:  Soft, nontender and nondistended. Normal bowel sounds.   Neuro/Psych:  Alert and cooperative. Normal mood and affect. A and O x 3   @Venisa Frampton  Sena Slate, MD, [facility] [phone] (pager) 11/15/2022 8:02 AM@

## 2022-11-15 ENCOUNTER — Encounter: Payer: Self-pay | Admitting: Internal Medicine

## 2022-11-15 ENCOUNTER — Ambulatory Visit (AMBULATORY_SURGERY_CENTER): Payer: 59 | Admitting: Internal Medicine

## 2022-11-15 VITALS — BP 117/75 | HR 65 | Temp 98.1°F | Resp 12 | Ht 72.0 in | Wt 163.4 lb

## 2022-11-15 DIAGNOSIS — D123 Benign neoplasm of transverse colon: Secondary | ICD-10-CM | POA: Diagnosis not present

## 2022-11-15 DIAGNOSIS — Z1211 Encounter for screening for malignant neoplasm of colon: Secondary | ICD-10-CM

## 2022-11-15 DIAGNOSIS — D126 Benign neoplasm of colon, unspecified: Secondary | ICD-10-CM

## 2022-11-15 HISTORY — DX: Benign neoplasm of colon, unspecified: D12.6

## 2022-11-15 MED ORDER — SODIUM CHLORIDE 0.9 % IV SOLN: 500.0000 mL | Freq: Once | INTRAVENOUS | Status: DC

## 2022-11-15 NOTE — Patient Instructions (Addendum)
I removed one tiny polyp that looks benign.  All else ok.  I will let you know pathology results and when to have another routine colonoscopy by mail and/or My Chart.  I appreciate the opportunity to care for you. Iva Boop, MD, [facility]  Discharge instructions given. Handout on polyps. Resume previous medications. YOU HAD AN ENDOSCOPIC PROCEDURE TODAY AT THE  ENDOSCOPY CENTER:   Refer to the procedure report that was given to you for any specific questions about what was found during the examination.  If the procedure report does not answer your questions, please call your gastroenterologist to clarify.  If you requested that your care partner not be given the details of your procedure findings, then the procedure report has been included in a sealed envelope for you to review at your convenience later.  YOU SHOULD EXPECT: Some feelings of bloating in the abdomen. Passage of more gas than usual.  Walking can help get rid of the air that was put into your GI tract during the procedure and reduce the bloating. If you had a lower endoscopy (such as a colonoscopy or flexible sigmoidoscopy) you may notice spotting of blood in your stool or on the toilet paper. If you underwent a bowel prep for your procedure, you may not have a normal bowel movement for a few days.  Please Note:  You might notice some irritation and congestion in your nose or some drainage.  This is from the oxygen used during your procedure.  There is no need for concern and it should clear up in a day or so.  SYMPTOMS TO REPORT IMMEDIATELY:  Following lower endoscopy (colonoscopy or flexible sigmoidoscopy):  Excessive amounts of blood in the stool  Significant tenderness or worsening of abdominal pains  Swelling of the abdomen that is new, acute  Fever of 100F or higher   For urgent or emergent issues, a gastroenterologist can be reached at any hour by calling [phone]. Do not use MyChart messaging for  urgent concerns.    DIET:  We do recommend a small meal at first, but then you may proceed to your regular diet.  Drink plenty of fluids but you should avoid alcoholic beverages for 24 hours.  ACTIVITY:  You should plan to take it easy for the rest of today and you should NOT DRIVE or use heavy machinery until tomorrow (because of the sedation medicines used during the test).    FOLLOW UP: Our staff will call the number listed on your records the next business day following your procedure.  We will call around 7:15- 8:00 am to check on you and address any questions or concerns that you may have regarding the information given to you following your procedure. If we do not reach you, we will leave a message.     If any biopsies were taken you will be contacted by phone or by letter within the next 1-3 weeks.  Please call us at [phone] if you have not heard about the biopsies in 3 weeks.    SIGNATURES/CONFIDENTIALITY: You and/or your care partner have signed paperwork which will be entered into your electronic medical record.  These signatures attest to the fact that that the information above on your After Visit Summary has been reviewed and is understood.  Full responsibility of the confidentiality of this discharge information lies with you and/or your care-partner.

## 2022-11-15 NOTE — Op Note (Signed)
[facility] Patient Name: Timothy Miles Procedure Date: 11/15/2022 8:00 AM MRN: [id number] Endoscopist: Iva Boop , MD, [id number] Age: 62 Referring MD:  Date of Birth: 05/11/1960 Gender: Male Account #: [id number] Procedure:                Colonoscopy Indications:              Screening for colorectal malignant neoplasm, Last                            colonoscopy: 2014 Medicines:                Monitored Anesthesia Care Procedure:                Pre-Anesthesia Assessment:                           - Prior to the procedure, a History and Physical                            was performed, and patient medications and                            allergies were reviewed. The patient's tolerance of                            previous anesthesia was also reviewed. The risks                            and benefits of the procedure and the sedation                            options and risks were discussed with the patient.                            All questions were answered, and informed consent                            was obtained. Prior Anticoagulants: The patient has                            taken no anticoagulant or antiplatelet agents. ASA                            Grade Assessment: I - A normal, healthy patient.                            After reviewing the risks and benefits, the patient                            was deemed in satisfactory condition to undergo the                            procedure.  After obtaining informed consent, the colonoscope                            was passed under direct vision. Throughout the                            procedure, the patient's blood pressure, pulse, and                            oxygen saturations were monitored continuously. The                            CF [id number] #[id number] was introduced through the anus                            and advanced to the the cecum, identified by                             appendiceal orifice and ileocecal valve. The                            colonoscopy was performed without difficulty. The                            patient tolerated the procedure well. The quality                            of the bowel preparation was good. The ileocecal                            valve, appendiceal orifice, and rectum were                            photographed. The bowel preparation used was SUPREP                            via split dose instruction. Scope In: 8:06:39 AM Scope Out: 8:21:32 AM Scope Withdrawal Time: 0 hours 11 minutes 15 seconds  Total Procedure Duration: 0 hours 14 minutes 53 seconds  Findings:                 The perianal and digital rectal examinations were                            normal.                           A diminutive polyp was found in the distal                            transverse colon. The polyp was sessile. The polyp                            was removed with a cold snare. Resection and  retrieval were complete. Verification of patient                            identification for the specimen was done. Estimated                            blood loss was minimal.                           The exam was otherwise without abnormality on                            direct and retroflexion views. Complications:            No immediate complications. Estimated Blood Loss:     Estimated blood loss was minimal. Impression:               - One diminutive polyp in the distal transverse                            colon, removed with a cold snare. Resected and                            retrieved.                           - The examination was otherwise normal on direct                            and retroflexion views. Hx of hemorrhoid banding                            but they were not prominent today. Recommendation:           - Patient has a contact number available for                             emergencies. The signs and symptoms of potential                            delayed complications were discussed with the                            patient. Return to normal activities tomorrow.                            Written discharge instructions were provided to the                            patient.                           - Continue present medications.                           - Repeat colonoscopy is recommended. The  colonoscopy date will be determined after pathology                            results from today's exam become available for                            review. Iva Boop, MD 11/15/2022 8:30:37 AM This report has been signed electronically.

## 2022-11-15 NOTE — Progress Notes (Signed)
Called to room to assist during endoscopic procedure.  Patient ID and intended procedure confirmed with present staff. Received instructions for my participation in the procedure from the performing physician.  

## 2022-11-18 ENCOUNTER — Telehealth: Payer: Self-pay | Admitting: *Deleted

## 2022-11-18 NOTE — Telephone Encounter (Signed)
  Follow up Call-     11/15/2022    7:18 AM  Call back number  Post procedure Call Back phone  # [phone]  Permission to leave phone message Yes     Post procedure follow up phone call. No answer at number given.  Left message on voicemail.

## 2022-11-19 LAB — SURGICAL PATHOLOGY

## 2022-11-21 ENCOUNTER — Encounter: Payer: Self-pay | Admitting: Internal Medicine

## 2023-09-08 ENCOUNTER — Telehealth: Payer: Self-pay

## 2023-09-08 NOTE — Telephone Encounter (Signed)
 Copied from CRM #[id number]. Topic: Appointments - Scheduling Inquiry for Clinic >> Sep 08, 2023 11:37 AM [place] J wrote: Reason for CRM: Patient was wondering if there was any way Dr Geofm could make the exception to see both the patient and his daughter (MRN: [id number]) for their physicals on the same day, preferably back-to-back. The patient's daughter is special needs and they have always done their physicals in this way.  Patient is aware of the next day turn around time. Please call once there is an update.

## 2023-09-08 NOTE — Telephone Encounter (Signed)
 Spoke with patient's spouse today and appointment moved up.

## 2023-10-09 NOTE — Progress Notes (Unsigned)
 Subjective:    Patient ID: Timothy Miles, male    DOB: Feb 23, 1960, 63 y.o.   MRN: [id number]     HPI Ian is here for a physical exam and his chronic medical problems.   Doing well.  No concerns.    Medications and allergies reviewed with patient and updated if appropriate.  Current Outpatient Medications on File Prior to Visit  Medication Sig Dispense Refill   Multiple Vitamin (MULTIVITAMIN) tablet Take 1 tablet by mouth daily.     No current facility-administered medications on file prior to visit.    Review of Systems  Constitutional:  Negative for fever.  Eyes:  Negative for visual disturbance.  Respiratory:  Negative for cough, shortness of breath and wheezing.   Cardiovascular:  Negative for chest pain, palpitations and leg swelling.  Gastrointestinal:  Negative for abdominal pain, blood in stool, constipation and diarrhea.       No gerd  Genitourinary:  Negative for difficulty urinating, dysuria and hematuria.  Musculoskeletal:  Negative for arthralgias and back pain.  Skin:  Negative for rash.  Neurological:  Negative for light-headedness and headaches.  Psychiatric/Behavioral:  Negative for dysphoric mood and sleep disturbance. The patient is not nervous/anxious.        Objective:   Vitals:   10/10/23 0820  BP: 120/70  Pulse: 69  Temp: 98.1 F (36.7 C)  SpO2: 96%   Filed Weights   10/10/23 0820  Weight: 172 lb (78 kg)   Body mass index is 23.33 kg/m.  BP Readings from Last 3 Encounters:  10/10/23 120/70  11/15/22 117/75  09/25/22 118/78    Wt Readings from Last 3 Encounters:  10/10/23 172 lb (78 kg)  11/15/22 163 lb 6.4 oz (74.1 kg)  10/25/22 165 lb (74.8 kg)      Physical Exam Constitutional: He appears well-developed and well-nourished. No distress.  HENT:  Head: Normocephalic and atraumatic.  Right Ear: External ear normal.  Left Ear: External ear normal.  Normal ear canals and TM b/l  Mouth/Throat: Oropharynx is clear and  moist. Eyes: Conjunctivae and EOM are normal.  Neck: Neck supple. No tracheal deviation present. No thyromegaly present.  No carotid bruit  Cardiovascular: Normal rate, regular rhythm, normal heart sounds and intact distal pulses.   No murmur heard.  No lower extremity edema. Pulmonary/Chest: Effort normal and breath sounds normal. No respiratory distress. He has no wheezes. He has no rales.  Abdominal: Soft. He exhibits no distension. There is no tenderness.  Genitourinary: deferred  Lymphadenopathy:   He has no cervical adenopathy.  Skin: Skin is warm and dry. He is not diaphoretic.  Psychiatric: He has a normal mood and affect. His behavior is normal.         Assessment & Plan:   Physical exam: Screening blood work  ordered Exercise   runs occasionally, walks at lunch Weight  normal Substance abuse   none   Reviewed recommended immunizations.   Health Maintenance  Topic Date Due   Pneumococcal Vaccine: 50+ Years (1 of 1 - PCV) Never done   Influenza Vaccine  08/15/2023   COVID-19 Vaccine (1 - 2024-25 season) 10/26/2023 (Originally 09/15/2023)   Zoster Vaccines- Shingrix (1 of 2) 01/09/2024 (Originally 08/13/2011)   DTaP/Tdap/Td (4 - Td or Tdap) 06/15/2029   Colonoscopy  11/14/2032   Hepatitis C Screening  Completed   HIV Screening  Completed   Hepatitis B Vaccines 19-59 Average Risk  Aged Out   HPV VACCINES  Aged Out  Meningococcal B Vaccine  Aged Out     See Problem List for Assessment and Plan of chronic medical problems.

## 2023-10-09 NOTE — Patient Instructions (Addendum)
 Blood work was ordered.       Medications changes include :   None    A referral was ordered and someone will call you to schedule an appointment.     Return in about 1 year (around 10/09/2024) for Physical Exam.    Health Maintenance, Male Adopting a healthy lifestyle and getting preventive care are important in promoting health and wellness. Ask your health care provider about: The right schedule for you to have regular tests and exams. Things you can do on your own to prevent diseases and keep yourself healthy. What should I know about diet, weight, and exercise? Eat a healthy diet  Eat a diet that includes plenty of vegetables, fruits, low-fat dairy products, and lean protein. Do not eat a lot of foods that are high in solid fats, added sugars, or sodium. Maintain a healthy weight Body mass index (BMI) is a measurement that can be used to identify possible weight problems. It estimates body fat based on height and weight. Your health care provider can help determine your BMI and help you achieve or maintain a healthy weight. Get regular exercise Get regular exercise. This is one of the most important things you can do for your health. Most adults should: Exercise for at least 150 minutes each week. The exercise should increase your heart rate and make you sweat (moderate-intensity exercise). Do strengthening exercises at least twice a week. This is in addition to the moderate-intensity exercise. Spend less time sitting. Even light physical activity can be beneficial. Watch cholesterol and blood lipids Have your blood tested for lipids and cholesterol at 62 years of age, then have this test every 5 years. You may need to have your cholesterol levels checked more often if: Your lipid or cholesterol levels are high. You are older than 62 years of age. You are at high risk for heart disease. What should I know about cancer screening? Many types of cancers can be  detected early and may often be prevented. Depending on your health history and family history, you may need to have cancer screening at various ages. This may include screening for: Colorectal cancer. Prostate cancer. Skin cancer. Lung cancer. What should I know about heart disease, diabetes, and high blood pressure? Blood pressure and heart disease High blood pressure causes heart disease and increases the risk of stroke. This is more likely to develop in people who have high blood pressure readings or are overweight. Talk with your health care provider about your target blood pressure readings. Have your blood pressure checked: Every 3-5 years if you are 64-46 years of age. Every year if you are 49 years old or older. If you are between the ages of 60 and 59 and are a current or former smoker, ask your health care provider if you should have a one-time screening for abdominal aortic aneurysm (AAA). Diabetes Have regular diabetes screenings. This checks your fasting blood sugar level. Have the screening done: Once every three years after age 79 if you are at a normal weight and have a low risk for diabetes. More often and at a younger age if you are overweight or have a high risk for diabetes. What should I know about preventing infection? Hepatitis B If you have a higher risk for hepatitis B, you should be screened for this virus. Talk with your health care provider to find out if you are at risk for hepatitis B infection. Hepatitis C Blood testing is recommended  for: Everyone born from [birth year] through 1964. Anyone with known risk factors for hepatitis C. Sexually transmitted infections (STIs) You should be screened each year for STIs, including gonorrhea and chlamydia, if: You are sexually active and are younger than 62 years of age. You are older than 62 years of age and your health care provider tells you that you are at risk for this type of infection. Your sexual activity has changed  since you were last screened, and you are at increased risk for chlamydia or gonorrhea. Ask your health care provider if you are at risk. Ask your health care provider about whether you are at high risk for HIV. Your health care provider may recommend a prescription medicine to help prevent HIV infection. If you choose to take medicine to prevent HIV, you should first get tested for HIV. You should then be tested every 3 months for as long as you are taking the medicine. Follow these instructions at home: Alcohol use Do not drink alcohol if your health care provider tells you not to drink. If you drink alcohol: Limit how much you have to 0-2 drinks a day. Know how much alcohol is in your drink. In the [place] one drink equals one 12 oz bottle of beer (355 mL), one 5 oz glass of wine (148 mL), or one 1 oz glass of hard liquor (44 mL). Lifestyle Do not use any products that contain nicotine or tobacco. These products include cigarettes, chewing tobacco, and vaping devices, such as e-cigarettes. If you need help quitting, ask your health care provider. Do not use street drugs. Do not share needles. Ask your health care provider for help if you need support or information about quitting drugs. General instructions Schedule regular health, dental, and eye exams. Stay current with your vaccines. Tell your health care provider if: You often feel depressed. You have ever been abused or do not feel safe at home. Summary Adopting a healthy lifestyle and getting preventive care are important in promoting health and wellness. Follow your health care provider's instructions about healthy diet, exercising, and getting tested or screened for diseases. Follow your health care provider's instructions on monitoring your cholesterol and blood pressure. This information is not intended to replace advice given to you by your health care provider. Make sure you discuss any questions you have with your health care  provider. Document Revised: 05/22/2020 Document Reviewed: 05/22/2020 Elsevier Patient Education  2024 [employer].

## 2023-10-10 ENCOUNTER — Ambulatory Visit: Admitting: Internal Medicine

## 2023-10-10 ENCOUNTER — Encounter: Payer: Self-pay | Admitting: Internal Medicine

## 2023-10-10 VITALS — BP 120/70 | HR 69 | Temp 98.1°F | Ht 72.0 in | Wt 172.0 lb

## 2023-10-10 DIAGNOSIS — Z125 Encounter for screening for malignant neoplasm of prostate: Secondary | ICD-10-CM

## 2023-10-10 DIAGNOSIS — Z0000 Encounter for general adult medical examination without abnormal findings: Secondary | ICD-10-CM

## 2023-10-10 DIAGNOSIS — N400 Enlarged prostate without lower urinary tract symptoms: Secondary | ICD-10-CM

## 2023-10-10 LAB — CBC
HCT: 45.5 % (ref 39.0–52.0)
Hemoglobin: 15.4 g/dL (ref 13.0–17.0)
MCHC: 33.8 g/dL (ref 30.0–36.0)
MCV: 83 fl (ref 78.0–100.0)
Platelets: 270 K/uL (ref 150.0–400.0)
RBC: 5.49 Mil/uL (ref 4.22–5.81)
RDW: 13 % (ref 11.5–15.5)
WBC: 4.9 K/uL (ref 4.0–10.5)

## 2023-10-10 LAB — COMPREHENSIVE METABOLIC PANEL WITH GFR
ALT: 19 U/L (ref 0–53)
AST: 17 U/L (ref 0–37)
Albumin: 4.3 g/dL (ref 3.5–5.2)
Alkaline Phosphatase: 113 U/L (ref 39–117)
BUN: 22 mg/dL (ref 6–23)
CO2: 30 meq/L (ref 19–32)
Calcium: 9.5 mg/dL (ref 8.4–10.5)
Chloride: 104 meq/L (ref 96–112)
Creatinine, Ser: 1.17 mg/dL (ref 0.40–1.50)
GFR: 66.98 mL/min (ref >60.00)
Glucose, Bld: 88 mg/dL (ref 70–99)
Potassium: 4.4 meq/L (ref 3.5–5.1)
Sodium: 140 meq/L (ref 135–145)
Total Bilirubin: 0.7 mg/dL (ref 0.2–1.2)
Total Protein: 6.7 g/dL (ref 6.0–8.3)

## 2023-10-10 LAB — LIPID PANEL
Cholesterol: 205 mg/dL — ABNORMAL HIGH (ref 0–200)
HDL: 39.7 mg/dL (ref >39.00)
LDL Cholesterol: 130 mg/dL — ABNORMAL HIGH (ref 0–99)
NonHDL: 165.15
Total CHOL/HDL Ratio: 5
Triglycerides: 178 mg/dL — ABNORMAL HIGH (ref 0.0–149.0)
VLDL: 35.6 mg/dL (ref 0.0–40.0)

## 2023-10-10 LAB — TSH: TSH: 0.7 u[IU]/mL (ref 0.35–5.50)

## 2023-10-10 LAB — PSA, MEDICARE: PSA: 1.23 ng/mL (ref 0.10–4.00)

## 2023-10-10 NOTE — Assessment & Plan Note (Signed)
Chronic No LUTS psa today

## 2023-10-11 ENCOUNTER — Ambulatory Visit: Payer: Self-pay | Admitting: Internal Medicine

## 2023-10-28 ENCOUNTER — Encounter: Admitting: Internal Medicine
